# Patient Record
Sex: Female | Born: 1980
Health system: Southern US, Community
[De-identification: ages and names within clinical notes are randomized; demographics above are authoritative.]

## PROBLEM LIST (undated history)

## (undated) DIAGNOSIS — M542 Cervicalgia: Secondary | ICD-10-CM

## (undated) DIAGNOSIS — G43909 Migraine, unspecified, not intractable, without status migrainosus: Secondary | ICD-10-CM

## (undated) HISTORY — DX: Migraine, unspecified, not intractable, without status migrainosus: G43.909

## (undated) HISTORY — DX: Cervicalgia: M54.2

## (undated) HISTORY — PX: TUBAL LIGATION: SHX77

## (undated) HISTORY — PX: ANTERIOR FUSION CERVICAL SPINE: SUR626

---

## 2014-06-02 ENCOUNTER — Emergency Department (HOSPITAL_COMMUNITY)
Admission: EM | Admit: 2014-06-02 | Discharge: 2014-06-02 | Disposition: A | Discharge status: Home / Self Care | Payer: Medicare Other | Attending: Emergency Medicine | Admitting: Emergency Medicine

## 2014-06-02 ENCOUNTER — Emergency Department (HOSPITAL_COMMUNITY): Payer: Medicare Other

## 2014-06-02 ENCOUNTER — Encounter (HOSPITAL_COMMUNITY): Payer: Self-pay | Admitting: *Deleted

## 2014-06-02 DIAGNOSIS — R102 Pelvic and perineal pain: Secondary | ICD-10-CM

## 2014-06-02 DIAGNOSIS — Z3202 Encounter for pregnancy test, result negative: Secondary | ICD-10-CM | POA: Insufficient documentation

## 2014-06-02 DIAGNOSIS — B9689 Other specified bacterial agents as the cause of diseases classified elsewhere: Secondary | ICD-10-CM

## 2014-06-02 DIAGNOSIS — Z9851 Tubal ligation status: Secondary | ICD-10-CM | POA: Diagnosis not present

## 2014-06-02 DIAGNOSIS — N760 Acute vaginitis: Secondary | ICD-10-CM | POA: Diagnosis not present

## 2014-06-02 LAB — URINE MICROSCOPIC-ADD ON

## 2014-06-02 LAB — WET PREP, GENITAL
Trich, Wet Prep: NONE SEEN
YEAST WET PREP: NONE SEEN

## 2014-06-02 LAB — URINALYSIS, ROUTINE W REFLEX MICROSCOPIC
Bilirubin Urine: NEGATIVE
GLUCOSE, UA: NEGATIVE mg/dL
HGB URINE DIPSTICK: NEGATIVE
Ketones, ur: NEGATIVE mg/dL
Nitrite: NEGATIVE
PH: 7.5 (ref 5.0–8.0)
Protein, ur: NEGATIVE mg/dL
Specific Gravity, Urine: 1.023 (ref 1.005–1.030)
Urobilinogen, UA: 1 mg/dL (ref 0.0–1.0)

## 2014-06-02 LAB — PREGNANCY, URINE: Preg Test, Ur: NEGATIVE

## 2014-06-02 MED ORDER — METRONIDAZOLE 500 MG PO TABS: 500.0000 mg | ORAL_TABLET | Freq: Two times a day (BID) | ORAL | Status: DC

## 2014-06-02 MED ORDER — NAPROXEN 250 MG PO TABS: 250.0000 mg | ORAL_TABLET | Freq: Two times a day (BID) | ORAL | Status: DC | PRN

## 2014-06-02 NOTE — ED Provider Notes (Signed)
CSN: [id number]     Arrival date & time 06/02/14  1736 History   First MD Initiated Contact with Patient 06/02/14 1741     Chief Complaint  Patient presents with  . Pelvic Pain  . Nausea     HPI Pt was seen at 1755. Per pt, c/o gradual onset and persistence of constant pelvic "pain" for the past 2 months. Has been associated with vaginal discharge. Describes the pain as "moving bubbles."  Pt has been evaluated by her PMD for same and "I was told it was gas." Denies dysuria/hematuria, no flank pain, no N/V/D, no fevers, no rash.    History reviewed. No pertinent past medical history.   Past Surgical History  Procedure Laterality Date  . Tubal ligation    . Anterior fusion cervical spine     No family history on file. History  Substance Use Topics  . Smoking status: Never Smoker   . Smokeless tobacco: Not on file  . Alcohol Use: No    Review of Systems ROS: Statement: All systems negative except as marked or noted in the HPI; Constitutional: Negative for fever and chills. ; ; Eyes: Negative for eye pain, redness and discharge. ; ; ENMT: Negative for ear pain, hoarseness, nasal congestion, sinus pressure and sore throat. ; ; Cardiovascular: Negative for chest pain, palpitations, diaphoresis, dyspnea and peripheral edema. ; ; Respiratory: Negative for cough, wheezing and stridor. ; ; Gastrointestinal: Negative for nausea, vomiting, diarrhea, abdominal pain, blood in stool, hematemesis, jaundice and rectal bleeding. . ; ; Genitourinary: Negative for dysuria, flank pain and hematuria. ; ; GYN:  +pelvic pain, vaginal discharge. No vaginal bleeding, no vulvar pain. ;; Musculoskeletal: Negative for back pain and neck pain. Negative for swelling and trauma.; ; Skin: Negative for pruritus, rash, abrasions, blisters, bruising and skin lesion.; ; Neuro: Negative for headache, lightheadedness and neck stiffness. Negative for weakness, altered level of consciousness , altered mental status, extremity  weakness, paresthesias, involuntary movement, seizure and syncope.      Allergies  Review of patient's allergies indicates no known allergies.  Home Medications   Prior to Admission medications   Medication Sig Start Date End Date Taking? Authorizing Provider  ibuprofen (ADVIL,MOTRIN) 800 MG tablet Take 800 mg by mouth every 8 (eight) hours as needed for mild pain or moderate pain.   Yes Historical Provider, MD   BP 133/79 mmHg  Pulse 63  Temp(Src) 98.6 F (37 C) (Oral)  Resp 20  SpO2 99%  LMP 05/15/2014 Physical Exam  1800: Physical examination:  Nursing notes reviewed; Vital signs and O2 SAT reviewed;  Constitutional: Well developed, Well nourished, Well hydrated, In no acute distress; Head:  Normocephalic, atraumatic; Eyes: EOMI, PERRL, No scleral icterus; ENMT: Mouth and pharynx normal, Mucous membranes moist; Neck: Supple, Full range of motion, No lymphadenopathy; Cardiovascular: Regular rate and rhythm, No murmur, rub, or gallop; Respiratory: Breath sounds clear & equal bilaterally, No rales, rhonchi, wheezes.  Speaking full sentences with ease, Normal respiratory effort/excursion; Chest: Nontender, Movement normal; Abdomen: Soft, +suprapubic and left pelvic tenderness to palp. No rebound or guarding. Nondistended, Normal bowel sounds; Genitourinary: No CVA tenderness. Pelvic exam performed with permission of pt and female ED tech assist during exam.  External genitalia w/o lesions. Vaginal vault with thin white discharge.  Cervix w/o lesions, not friable, GC/chlam and wet prep obtained and sent to lab.  Bimanual exam w/o CMT, uterine or adnexal tenderness.; Extremities: Pulses normal, No tenderness, No edema, No calf edema or asymmetry.; Neuro:  AA&Ox3, Major CN grossly intact.  Speech clear. No gross focal motor or sensory deficits in extremities. Climbs on and off stretcher easily by herself. Gait steady.; Skin: Color normal, Warm, Dry.   ED Course  Procedures     EKG  Interpretation None      MDM  MDM Reviewed: nursing note and vitals Interpretation: labs and ultrasound     Results for orders placed or performed during the hospital encounter of 06/02/14  Wet prep, genital  Result Value Ref Range   Yeast Wet Prep HPF POC NONE SEEN NONE SEEN   Trich, Wet Prep NONE SEEN NONE SEEN   Clue Cells Wet Prep HPF POC FEW (A) NONE SEEN   WBC, Wet Prep HPF POC RARE (A) NONE SEEN  Urinalysis, Routine w reflex microscopic  Result Value Ref Range   Color, Urine YELLOW YELLOW   APPearance CLOUDY (A) CLEAR   Specific Gravity, Urine 1.023 1.005 - 1.030   pH 7.5 5.0 - 8.0   Glucose, UA NEGATIVE NEGATIVE mg/dL   Hgb urine dipstick NEGATIVE NEGATIVE   Bilirubin Urine NEGATIVE NEGATIVE   Ketones, ur NEGATIVE NEGATIVE mg/dL   Protein, ur NEGATIVE NEGATIVE mg/dL   Urobilinogen, UA 1.0 0.0 - 1.0 mg/dL   Nitrite NEGATIVE NEGATIVE   Leukocytes, UA TRACE (A) NEGATIVE  Pregnancy, urine  Result Value Ref Range   Preg Test, Ur NEGATIVE NEGATIVE  Urine microscopic-add on  Result Value Ref Range   Squamous Epithelial / LPF FEW (A) RARE   WBC, UA 0-2 <3 WBC/hpf   RBC / HPF 3-6 <3 RBC/hpf   Bacteria, UA FEW (A) RARE   [place] Pelvis Complete 06/02/2014   CLINICAL DATA:  Initial evaluate for pelvic pain, concern for torsion  EXAM: TRANSABDOMINAL AND TRANSVAGINAL ULTRASOUND OF PELVIS  DOPPLER ULTRASOUND OF OVARIES  TECHNIQUE: Both transabdominal and transvaginal ultrasound examinations of the pelvis were performed. Transabdominal technique was performed for global imaging of the pelvis including uterus, ovaries, adnexal regions, and pelvic cul-de-sac.  It was necessary to proceed with endovaginal exam following the transabdominal exam to visualize the endometrium and ovaries. Color and duplex Doppler ultrasound was utilized to evaluate blood flow to the ovaries.  COMPARISON:  None.  FINDINGS: Uterus  Measurements: 9.6 x 5.8 x 6.6 cm. No fibroids or other focal mass is identified.  However, echotexture of the uterus is heterogeneous suggesting the possibility of adenomyosis or diffuse fibroid involvement.  Endometrium  Thickness: 10 mm.  No focal abnormality visualized.  Right ovary  Measurements: 38 x 22 x 27 mm. Normal appearance/no adnexal mass. 18 mm follicular cyst noted.  Left ovary  Measurements: 41 x 19 x 34 mm. Normal appearance/no adnexal mass.  Pulsed Doppler evaluation of both ovaries demonstrates normal low-resistance arterial and venous waveforms.  Other findings  Trace free fluid in the left adnexa.  IMPRESSION: Physiologic trace volume of free fluid.  No evidence of torsion.  Heterogeneous myometrium suggests possibility of adenomyosis or diffuse fibroid involvement, with no discrete mass lesions identified.   Electronically Signed   By: Esperanza Heiraymond  Rubner M.D.   On: 06/02/2014 19:22    2030:  Will tx for BV. Workup otherwise reassuring, will need to f/u with OB/GYN MD. Pt has gotten herself dressed and wants to go home now. Dx and testing d/w pt.  Questions answered.  Verb understanding, agreeable to d/c home with outpt f/u.   Samuel JesterKathleen Jamarious Febo, DO 06/03/14 1356

## 2014-06-02 NOTE — ED Notes (Signed)
Pt returned from US

## 2014-06-02 NOTE — Discharge Instructions (Signed)
°Emergency Department Resource Guide °1) Find a Doctor and Pay Out of Pocket °Although you won't have to find out who is covered by your insurance plan, it is a good idea to ask around and get recommendations. You will then need to call the office and see if the doctor you have chosen will accept you as a new patient and what types of options they offer for patients who are self-pay. Some doctors offer discounts or will set up payment plans for their patients who do not have insurance, but you will need to ask so you aren't surprised when you get to your appointment. ° °2) Contact Your Local Health Department °Not all health departments have doctors that can see patients for sick visits, but many do, so it is worth a call to see if yours does. If you don't know where your local health department is, you can check in your phone book. The CDC also has a tool to help you locate your state's health department, and many state websites also have listings of all of their local health departments. ° °3) Find a Walk-in Clinic °If your illness is not likely to be very severe or complicated, you may want to try a walk in clinic. These are popping up all over the country in pharmacies, drugstores, and shopping centers. They're usually staffed by nurse practitioners or physician assistants that have been trained to treat common illnesses and complaints. They're usually fairly quick and inexpensive. However, if you have serious medical issues or chronic medical problems, these are probably not your best option. ° °No Primary Care Doctor: °- Call Health Connect at  832-8000 - they can help you locate a primary care doctor that  accepts your insurance, provides certain services, etc. °- Physician Referral Service- 1-800-533-3463 ° °Chronic Pain Problems: °Organization         Address  Phone   Notes  °[facility]  ([phone]) [phone] Patients need to be referred by their primary care doctor.  ° °Medication  Assistance: °Organization         Address  Phone   Notes  °[place] [facility] [address] °[place], [place] [address] (336) 641-8030 --Must be a resident of [place] °-- Must have NO insurance coverage whatsoever (no Medicaid/ Medicare, etc.) °-- The pt. MUST have a primary care doctor that directs their care regularly and follows them in the community °  °[facility]  (866) 331-1348   °[employer]  (888) 892-1162   ° °Agencies that provide inexpensive medical care: °Organization         Address  Phone   Notes  °[facility]  (336) 832-8035   °[facility]    (336) 832-7272   °[facility] [address] °[place], [place] [address] (336) 832-4777   °[facility] [place] [address], °[place] (336) 271-4999   °[facility]    (336) 373-0678   °[facility]    (336) 272-1050   °[facility] ° [address], [place] Phone:  (336) 832-4444, Fax:  (336) 832-4440 Hours of Operation:  9 am - 6 pm, M-F.  Also accepts Medicaid/Medicare and self-pay.  °[facility] ° [address], [place] Phone: (336) [phone], Fax: (336) [phone]. Hours of Operation:  8:30 am - 5:30 pm, M-F.  Also accepts Medicaid and self-pay.  °[facility] [place] [address], [place] Phone: (336) 878-6027   °[facility] [address], [place], [place] (336)723-1848, Ext. 123 Mondays & Thursdays: 7-9 AM.  First 15 patients are seen on a first come, first serve basis. °  ° °Medicaid-accepting [place] Providers: ° °Organization         Address  Phone   Notes  °[facility] [address], [place] (336) [phone] Also accepts self-pay patients.  °[facility] [address], [place] ° (336) 856-9996   °[facility] [address], [place]  (336) 288-8857   °[facility] [address] [place] [address], [place] (336) 299-7000   °Veita Bland [address], [place]  ° (336) 373-1557 Only accepts [place] Access Medicaid patients after they have their name applied to their card.  ° °Self-Pay (no insurance) in [place]: ° °Organization         Address  Phone   Notes  °Sickle Cell Patients, [facility] [address], [place] (336) 832-1970   °[facility] [address], [place] (336) 832-4400   °[facility] ° [address], [place] (336) 992-4800   °[facility]/Dr. Osei-Bonsu ° [address] [place] [address], [place] or [address], [place] (336) [phone] Phone number for both [place] and [place] locations is the same.  °[facility] [address], [place] (336) 299-0000   °[facility] [address] [place] [address], [place] or [address] (336) 852-7530 °(336) 878-2260   °[facility] [address], [place] (336) 350-1642, phone; (336) 294-5005, fax Sees patients 1st and 3rd Saturday of every month.  Must not qualify for public or private insurance (i.e. Medicaid, Medicare, [place] Health Choice, Veterans' Benefits) • Household income should be no more than 200% of the poverty level •The clinic cannot treat you if you are pregnant or think you are pregnant • Sexually transmitted diseases are not treated at the clinic.  ° ° °Dental Care: °Organization         Address  Phone  Notes  °[place] [facility] [address], [place] (336) 641-6152 Accepts children up to age 21 who are enrolled in Medicaid or [place] Health Choice; pregnant women with a Medicaid card; and children who have applied for Medicaid or [place] Health Choice, but were declined, whose parents can pay a reduced fee at time of service.  °[place]  [facility] [place]  [address], [place] (336) 641-7733 Accepts children up to age 21 who are enrolled in Medicaid or [place] Health Choice; pregnant women with a Medicaid card; and children who have applied for Medicaid or [place] Health Choice, but were declined, whose parents can pay a reduced fee at time of service.  °[facility] ° [address], [place] (336) 641-4533 Patients are seen by appointment only. Walk-ins are not accepted. [facility] will see patients 18 years of age and older. °Monday - Tuesday (8am-5pm) °Most Wednesdays (8:30-5pm) °$30 per visit, cash only  °[facility] ° [address], [place] (336) 641-4533 Patients are seen by appointment only. Walk-ins are not accepted. [facility] will see patients 18 years of age and older. °One   Wednesday Evening (Monthly: Volunteer Based).  $30 per visit, cash only  °[facility]  (919) [phone] for adults; Children under age 4, call Graduate Pediatric Dentistry at ([phone]) [phone]. Children aged 4-14, please call ([phone]) [phone] to request a pediatric application. ° Dental services are provided in all areas of dental care including fillings, crowns and bridges, complete and partial dentures, implants, gum treatment, root canals, and extractions. Preventive care is also provided. Treatment is provided to both adults and children. °Patients are selected via a lottery and there is often a waiting list. °  °[facility] [address], °[place] ° (336) 763-8833 [email or web address] °  °[facility] [address], [place], [place] (336)723-1848, Ext. 123 Second and Fourth Thursday of each month, opens at 6:30 AM; Clinic ends at 9 AM.  Patients are seen on a first-come first-served basis, and a limited number are seen during each clinic.  ° °[facility] ° [address], [place], [place] (336) [phone]    Eligibility Requirements °You must have lived in [place], [place], or [place] counties for at least the last three months. °  You cannot be eligible for state or federal sponsored healthcare insurance, including Veterans Administration, Medicaid, or Medicare. °  You generally cannot be eligible for healthcare insurance through your employer.  °  How to apply: °Eligibility screenings are held every Tuesday and Wednesday afternoon from 1:00 pm until 4:00 pm. You do not need an appointment for the interview!  °[facility] [address], [place], [place] 336-631-2330   °[facility]  336-342-8273   °[facility]  336-703-3100   °[facility]  336-570-6415   ° °Behavioral Health Resources in the Community: °Intensive Outpatient Programs °Organization         Address  Phone  Notes  °[facility] [address], [place], [place] 336-878-6098   °[facility] [address], [place], [place] 336-832-9800   °[facility] [address], [place], [place] ° 336-882-2125   °[facility] [address],  °[place], [place] 1-800-853-5163 or 336-641-4981   °Substance Abuse Resources °Organization         Address  Phone  Notes  °[facility]  336-882-2125   °[facility]  336-784-9470   °[facility]  336-285-9073   °[facility]  336-845-3988   °Residential & Outpatient Substance Abuse Program  1-800-659-3381   °Psychological Services °Organization         Address  Phone  Notes  °[facility]  336- 832-9600   °[facility]  336- 378-7881   °[facility] [address], [place] 1-800-853-5163 or 336-641-4981   ° °Mobile Crisis Teams °Organization         Address  Phone  Notes  °[facility], Mobile Crisis Care Unit  1-877-626-1772   °Assertive °[facility] ° [address]  [place], [place] 336-834-9664   °Sharon DeEsch [address] °[place] 336-554-5454   ° °Self-Help/Support Groups °Organization         Address  Phone             Notes  °[facility] of  - variety of support groups  336- 373-1402 Call for more information  °Narcotics Anonymous (NA), [facility] [address], °[place]  2 meetings at this location  ° °  Residential Treatment Programs Organization         Address  Phone  Notes  [facility] [address],    [place]  [phone]   [facility]  [address], [place] [phone], [place], [place] [phone]) [phone]   [facility] [address], [place] [phone](647) 660-4023 Admissions: 8am-3pm M-F  [facility] [address]    [place], [place] [phone]   [facility] [address], [place], [place] [phone]   [facility] [address]  [place], [place] [phone]   [facility] Intensive Outpatient [address], [place], [place] [phone]   [facility] [address]  [place], [place] [phone] or [phone]   [facility] ([facility] [address] [place], [place] [phone] Accepts Medicaid  [facility] [address]  [place] [phone] Substance Abuse/Addiction Treatment   [facility] Resources Organization         Address  Phone  Notes  [facility]  [phone](888) [phone]   Angie FavaJulie Brannon, PhD [address], [address] [place], [place]   [phone](336) [phone] or [phone](336) [phone]   [facility]   [address] [place], [place] [phone](336) [phone]   [facility] [address], [place], [place] [phone](336) [phone] Insurance/Medicaid/sponsorship through [facility]  [facility] [address]                                    [place], [place] [phone](336) [phone] Therapy/tele-psych/case    [facility] [address]   [place], [place] [phone](336) [phone]    Dr. Lolly MustacheArfeen  ([phone](336) [phone]   [facility] [place]  [employer] [facility] 1) [address], [place] 2) [address], [place] 3)  [address], [place] [phone](336) ([phone](336) (480) [phone]  [phone](336) [phone]   [facility] Child Abuse Hotline [phone](336) [phone] or [phone](336) [phone] (After Hours)      Take the prescriptions as directed. Your pelvic ultrasound showed: "echotexture of the uterus is heterogeneous suggesting the possibility of adenomyosis or diffuse fibroid involvement."  Call your OB/GYN doctor on Monday to schedule a follow up appointment within the next week.  Return to the Emergency Department immediately sooner if worsening.

## 2014-06-02 NOTE — ED Notes (Signed)
Pt reports feeling "moving/bubbles" in her abd.for the past month. C/o nausea for a week. Sts she has had her tubes tied but was told she could still get pregnant. Took preg test last week, negative but sts her previous pregnancies never showed thru urine tests. LMP 05-15-14. Lower abd pain x1 week. Denies vaginal bleeding. Small amt vaginal discharge.

## 2014-06-04 LAB — GC/CHLAMYDIA PROBE AMP
CT Probe RNA: NEGATIVE
GC Probe RNA: NEGATIVE

## 2014-12-02 ENCOUNTER — Encounter (HOSPITAL_COMMUNITY): Payer: Self-pay | Admitting: *Deleted

## 2014-12-02 ENCOUNTER — Emergency Department (HOSPITAL_COMMUNITY)
Admission: EM | Admit: 2014-12-02 | Discharge: 2014-12-02 | Disposition: A | Discharge status: Home / Self Care | Payer: Medicare Other | Attending: Emergency Medicine | Admitting: Emergency Medicine

## 2014-12-02 ENCOUNTER — Emergency Department (HOSPITAL_COMMUNITY): Payer: Medicare Other

## 2014-12-02 DIAGNOSIS — N939 Abnormal uterine and vaginal bleeding, unspecified: Secondary | ICD-10-CM | POA: Diagnosis present

## 2014-12-02 DIAGNOSIS — R1030 Lower abdominal pain, unspecified: Secondary | ICD-10-CM | POA: Insufficient documentation

## 2014-12-02 DIAGNOSIS — Z9851 Tubal ligation status: Secondary | ICD-10-CM | POA: Diagnosis not present

## 2014-12-02 DIAGNOSIS — Z3202 Encounter for pregnancy test, result negative: Secondary | ICD-10-CM | POA: Insufficient documentation

## 2014-12-02 LAB — COMPREHENSIVE METABOLIC PANEL
ALK PHOS: 49 U/L (ref 38–126)
ALT: 15 U/L (ref 14–54)
ANION GAP: 7 (ref 5–15)
AST: 23 U/L (ref 15–41)
Albumin: 3.9 g/dL (ref 3.5–5.0)
BUN: 6 mg/dL (ref 6–20)
CHLORIDE: 106 mmol/L (ref 101–111)
CO2: 27 mmol/L (ref 22–32)
Calcium: 9.4 mg/dL (ref 8.9–10.3)
Creatinine, Ser: 0.82 mg/dL (ref 0.44–1.00)
GFR calc Af Amer: >60 mL/min (ref >60)
GFR calc non Af Amer: >60 mL/min (ref >60)
GLUCOSE: 88 mg/dL (ref 65–99)
Potassium: 4.2 mmol/L (ref 3.5–5.1)
Sodium: 140 mmol/L (ref 135–145)
TOTAL PROTEIN: 7 g/dL (ref 6.5–8.1)
Total Bilirubin: 0.6 mg/dL (ref 0.3–1.2)

## 2014-12-02 LAB — URINALYSIS, ROUTINE W REFLEX MICROSCOPIC
BILIRUBIN URINE: NEGATIVE
GLUCOSE, UA: NEGATIVE mg/dL
Ketones, ur: NEGATIVE mg/dL
Leukocytes, UA: NEGATIVE
Nitrite: NEGATIVE
PROTEIN: NEGATIVE mg/dL
Specific Gravity, Urine: 1.013 (ref 1.005–1.030)
Urobilinogen, UA: 0.2 mg/dL (ref 0.0–1.0)
pH: 7.5 (ref 5.0–8.0)

## 2014-12-02 LAB — WET PREP, GENITAL
Trich, Wet Prep: NONE SEEN
Yeast Wet Prep HPF POC: NONE SEEN

## 2014-12-02 LAB — CBC WITH DIFFERENTIAL/PLATELET
BASOS ABS: 0 10*3/uL (ref 0.0–0.1)
BASOS PCT: 0 % (ref 0–1)
EOS PCT: 2 % (ref 0–5)
Eosinophils Absolute: 0.1 10*3/uL (ref 0.0–0.7)
HEMATOCRIT: 35.9 % — AB (ref 36.0–46.0)
HEMOGLOBIN: 11.7 g/dL — AB (ref 12.0–15.0)
LYMPHS ABS: 1.8 10*3/uL (ref 0.7–4.0)
Lymphocytes Relative: 28 % (ref 12–46)
MCH: 27.4 pg (ref 26.0–34.0)
MCHC: 32.6 g/dL (ref 30.0–36.0)
MCV: 84.1 fL (ref 78.0–100.0)
Monocytes Absolute: 0.4 10*3/uL (ref 0.1–1.0)
Monocytes Relative: 7 % (ref 3–12)
Neutro Abs: 3.9 10*3/uL (ref 1.7–7.7)
Neutrophils Relative %: 63 % (ref 43–77)
Platelets: 297 10*3/uL (ref 150–400)
RBC: 4.27 MIL/uL (ref 3.87–5.11)
RDW: 14.4 % (ref 11.5–15.5)
WBC: 6.2 10*3/uL (ref 4.0–10.5)

## 2014-12-02 LAB — URINE MICROSCOPIC-ADD ON

## 2014-12-02 LAB — POC URINE PREG, ED: Preg Test, Ur: NEGATIVE

## 2014-12-02 MED ORDER — SODIUM CHLORIDE 0.9 % IV BOLUS (SEPSIS)
1000.0000 mL | Freq: Once | INTRAVENOUS | Status: AC
  Administered 2014-12-02: 1000 mL via INTRAVENOUS

## 2014-12-02 NOTE — ED Notes (Signed)
Pt states that she has had irregular period last month and spotting since then. Pt also reports lower abdominal cramping.

## 2014-12-02 NOTE — ED Provider Notes (Signed)
CSN: [id number]     Arrival date & time 12/02/14  1010 History   First MD Initiated Contact with Patient 12/02/14 1124     Chief Complaint  Patient presents with  . Vaginal Bleeding     (Consider location/radiation/quality/duration/timing/severity/associated sxs/prior Treatment) The history is provided by the patient. No language interpreter was used.  Gabriela Gonzalez is a 34 y/o F with PMHx of tubal ligation approximate 5 years ago presenting to the ED with vaginal bleeding that started on 11/30/2014. Patient reports that the vaginal bleeding is a very light pink discharge, states that she's been wearing panty liners without saturation-reports that she sees it only when she wipes and only on the panty liners. Reported that within the past couple of days she is only had to change her pain to liner 2 times. Reported that she has not seen any blood clots. Reported that she started to experience sharp pain to the middle of her lower abdomen yesterday, that lasted a couple of hours. Reported that the pain was intense. Stated that today she started to experience some cramping in the morning localized to her lower abdomen with radiation towards her back that is now resolved. Stated that she did have an episode of nausea yesterday that is now resolved without episodes of emesis. Reported that she did have a bowel movement yesterday and today that appeared to be normal. Reported that she normally gets her menstrual periods every 19th of the month. Reported that she is sexually active with her husband. Denied melena, hematochezia, blood clots, vaginal discharge, odor, dysuria, hematuria, chest pain, shortness of breath, difficulty breathing, decreased urination, vomiting, diarrhea, dizziness, fainting. PCP Dr. Jonell Cluck  [facility]  History reviewed. No pertinent past medical history. Past Surgical History  Procedure Laterality Date  . Tubal ligation    . Anterior fusion cervical spine     No family  history on file. History  Substance Use Topics  . Smoking status: Never Smoker   . Smokeless tobacco: Not on file  . Alcohol Use: No   OB History    No data available     Review of Systems  Constitutional: Negative for fever and chills.  Eyes: Negative for visual disturbance.  Respiratory: Negative for chest tightness and shortness of breath.   Cardiovascular: Negative for chest pain.  Gastrointestinal: Positive for nausea and abdominal pain. Negative for vomiting, diarrhea, constipation, blood in stool and anal bleeding.  Genitourinary: Positive for vaginal bleeding and pelvic pain. Negative for dysuria, urgency, vaginal discharge and vaginal pain.  Musculoskeletal: Positive for back pain. Negative for neck pain and neck stiffness.  Neurological: Negative for dizziness, weakness and headaches.      Allergies  Review of patient's allergies indicates no known allergies.  Home Medications   Prior to Admission medications   Medication Sig Start Date End Date Taking? Authorizing Provider  ibuprofen (ADVIL,MOTRIN) 800 MG tablet Take 800 mg by mouth every 8 (eight) hours as needed for mild pain or moderate pain.   Yes Historical Provider, MD   BP 100/58 mmHg  Pulse 75  Temp(Src) 98.3 F (36.8 C) (Oral)  Resp 18  Ht  (1.499 m)  Wt 169 lb (76.658 kg)  BMI 34.12 kg/m2  SpO2 100% Physical Exam  Constitutional: She is oriented to person, place, and time. She appears well-developed and well-nourished. No distress.  HENT:  Head: Normocephalic and atraumatic.  Mouth/Throat: Oropharynx is clear and moist. No oropharyngeal exudate.  Eyes: Conjunctivae and EOM are normal. Pupils  are equal, round, and reactive to light. Right eye exhibits no discharge. Left eye exhibits no discharge.  Neck: Normal range of motion. Neck supple. No tracheal deviation present.  Cardiovascular: Normal rate, regular rhythm and normal heart sounds.  Exam reveals no friction rub.   No murmur  heard. Pulmonary/Chest: Effort normal and breath sounds normal. No respiratory distress. She has no wheezes. She has no rales.  Abdominal: Soft. Bowel sounds are normal. She exhibits no distension. There is tenderness in the suprapubic area. There is no rebound, no guarding and no CVA tenderness.  Genitourinary:  Pelvic exam: Negative swelling, erythema, inflammation, lesions, sores, deformities, areas of fluctuance or induration identified to the external genitalia. Bright red blood in the vaginal vault identified coming from the cervix. Negative CMT or bilateral adnexal tenderness noted. Exam chaperoned with tech, Marcelle SmilingNatasha.  Musculoskeletal: Normal range of motion.  Lymphadenopathy:    She has no cervical adenopathy.  Neurological: She is alert and oriented to person, place, and time. No cranial nerve deficit. She exhibits normal muscle tone. Coordination normal. GCS eye subscore is 4. GCS verbal subscore is 5. GCS motor subscore is 6.  Skin: Skin is warm and dry. No rash noted. She is not diaphoretic. No erythema.  Psychiatric: She has a normal mood and affect. Her behavior is normal. Thought content normal.  Nursing note and vitals reviewed.   ED Course  Procedures (including critical care time)  Results for orders placed or performed during the hospital encounter of 12/02/14  Wet prep, genital  Result Value Ref Range   Yeast Wet Prep HPF POC NONE SEEN NONE SEEN   Trich, Wet Prep NONE SEEN NONE SEEN   Clue Cells Wet Prep HPF POC FEW (A) NONE SEEN   WBC, Wet Prep HPF POC FEW (A) NONE SEEN  CBC with Differential/Platelet  Result Value Ref Range   WBC 6.2 4.0 - 10.5 K/uL   RBC 4.27 3.87 - 5.11 MIL/uL   Hemoglobin 11.7 (L) 12.0 - 15.0 g/dL   HCT 69.635.9 (L) 29.536.0 - 28.446.0 %   MCV 84.1 78.0 - 100.0 fL   MCH 27.4 26.0 - 34.0 pg   MCHC 32.6 30.0 - 36.0 g/dL   RDW 13.214.4 44.011.5 - 10.215.5 %   Platelets 297 150 - 400 K/uL   Neutrophils Relative % 63 43 - 77 %   Neutro Abs 3.9 1.7 - 7.7 K/uL    Lymphocytes Relative 28 12 - 46 %   Lymphs Abs 1.8 0.7 - 4.0 K/uL   Monocytes Relative 7 3 - 12 %   Monocytes Absolute 0.4 0.1 - 1.0 K/uL   Eosinophils Relative 2 0 - 5 %   Eosinophils Absolute 0.1 0.0 - 0.7 K/uL   Basophils Relative 0 0 - 1 %   Basophils Absolute 0.0 0.0 - 0.1 K/uL  Comprehensive metabolic panel  Result Value Ref Range   Sodium 140 135 - 145 mmol/L   Potassium 4.2 3.5 - 5.1 mmol/L   Chloride 106 101 - 111 mmol/L   CO2 27 22 - 32 mmol/L   Glucose, Bld 88 65 - 99 mg/dL   BUN 6 6 - 20 mg/dL   Creatinine, Ser 7.250.82 0.44 - 1.00 mg/dL   Calcium 9.4 8.9 - 36.610.3 mg/dL   Total Protein 7.0 6.5 - 8.1 g/dL   Albumin 3.9 3.5 - 5.0 g/dL   AST 23 15 - 41 U/L   ALT 15 14 - 54 U/L   Alkaline Phosphatase 49 38 -  126 U/L   Total Bilirubin 0.6 0.3 - 1.2 mg/dL   GFR calc non Af Amer >60 >60 mL/min   GFR calc Af Amer >60 >60 mL/min   Anion gap 7 5 - 15  Urinalysis, Routine w reflex microscopic (not at [facility])  Result Value Ref Range   Color, Urine YELLOW YELLOW   APPearance CLOUDY (A) CLEAR   Specific Gravity, Urine 1.013 1.005 - 1.030   pH 7.5 5.0 - 8.0   Glucose, UA NEGATIVE NEGATIVE mg/dL   Hgb urine dipstick LARGE (A) NEGATIVE   Bilirubin Urine NEGATIVE NEGATIVE   Ketones, ur NEGATIVE NEGATIVE mg/dL   Protein, ur NEGATIVE NEGATIVE mg/dL   Urobilinogen, UA 0.2 0.0 - 1.0 mg/dL   Nitrite NEGATIVE NEGATIVE   Leukocytes, UA NEGATIVE NEGATIVE  Urine microscopic-add on  Result Value Ref Range   Squamous Epithelial / LPF FEW (A) RARE   WBC, UA 0-2 <3 WBC/hpf   RBC / HPF 0-2 <3 RBC/hpf   Bacteria, UA FEW (A) RARE  POC urine preg, ED (not at [facility])  Result Value Ref Range   Preg Test, Ur NEGATIVE NEGATIVE    Labs Review Labs Reviewed  WET PREP, GENITAL - Abnormal; Notable for the following:    Clue Cells Wet Prep HPF POC FEW (*)    WBC, Wet Prep HPF POC FEW (*)    All other components within normal limits  CBC WITH DIFFERENTIAL/PLATELET - Abnormal; Notable for the following:     Hemoglobin 11.7 (*)    HCT 35.9 (*)    All other components within normal limits  URINALYSIS, ROUTINE W REFLEX MICROSCOPIC (NOT AT [facility]) - Abnormal; Notable for the following:    APPearance CLOUDY (*)    Hgb urine dipstick LARGE (*)    All other components within normal limits  URINE MICROSCOPIC-ADD ON - Abnormal; Notable for the following:    Squamous Epithelial / LPF FEW (*)    Bacteria, UA FEW (*)    All other components within normal limits  COMPREHENSIVE METABOLIC PANEL  POC URINE PREG, ED  GC/CHLAMYDIA PROBE AMP ([place]) NOT AT [facility]    Imaging Review US Transvaginal Non-ob  12/02/2014   CLINICAL DATA:  Acute onset pelvic pain and vaginal bleeding yesterday. LMP 10/30/2014.  EXAM: TRANSABDOMINAL AND TRANSVAGINAL ULTRASOUND OF PELVIS  DOPPLER ULTRASOUND OF OVARIES  TECHNIQUE: Both transabdominal and transvaginal ultrasound examinations of the pelvis were performed. Transabdominal technique was performed for global imaging of the pelvis including uterus, ovaries, adnexal regions, and pelvic cul-de-sac.  It was necessary to proceed with endovaginal exam following the transabdominal exam to visualize the endometrial stripe and ovaries. Color and duplex Doppler ultrasound was utilized to evaluate blood flow to the ovaries.  COMPARISON:  06/02/2014  FINDINGS: Uterus  Measurements: 11.6 x 4.8 x 6.4 cm. No fibroids or other mass visualized.  Endometrium  Thickness: 7 mm.  No focal abnormality visualized.  Right ovary  Measurements: 3.1 x 1.3 x 2.6 cm. Normal appearance/no adnexal mass.  Left ovary  Measurements: 3.2 x 1.9 x 2.6 cm. Normal appearance/no adnexal mass.  Pulsed Doppler evaluation of both ovaries demonstrates normal low-resistance arterial and venous waveforms.  Other findings  No free fluid.  IMPRESSION: Heterogeneous echogenic to uterine myometrium, without definite fibroids.  Normal appearance of both ovaries.  No adnexal mass identified.  No sonographic evidence for ovarian  torsion.   Electronically Signed   By: Myles Rosenthal M.D.   On: 12/02/2014 14:17   US Pelvis  Complete  12/02/2014   CLINICAL DATA:  Acute onset pelvic pain and vaginal bleeding yesterday. LMP 10/30/2014.  EXAM: TRANSABDOMINAL AND TRANSVAGINAL ULTRASOUND OF PELVIS  DOPPLER ULTRASOUND OF OVARIES  TECHNIQUE: Both transabdominal and transvaginal ultrasound examinations of the pelvis were performed. Transabdominal technique was performed for global imaging of the pelvis including uterus, ovaries, adnexal regions, and pelvic cul-de-sac.  It was necessary to proceed with endovaginal exam following the transabdominal exam to visualize the endometrial stripe and ovaries. Color and duplex Doppler ultrasound was utilized to evaluate blood flow to the ovaries.  COMPARISON:  06/02/2014  FINDINGS: Uterus  Measurements: 11.6 x 4.8 x 6.4 cm. No fibroids or other mass visualized.  Endometrium  Thickness: 7 mm.  No focal abnormality visualized.  Right ovary  Measurements: 3.1 x 1.3 x 2.6 cm. Normal appearance/no adnexal mass.  Left ovary  Measurements: 3.2 x 1.9 x 2.6 cm. Normal appearance/no adnexal mass.  Pulsed Doppler evaluation of both ovaries demonstrates normal low-resistance arterial and venous waveforms.  Other findings  No free fluid.  IMPRESSION: Heterogeneous echogenic to uterine myometrium, without definite fibroids.  Normal appearance of both ovaries.  No adnexal mass identified.  No sonographic evidence for ovarian torsion.   Electronically Signed   By: Myles Rosenthal M.D.   On: 12/02/2014 14:17   [place] Art/ven Flow Abd Pelv Doppler  12/02/2014   CLINICAL DATA:  Acute onset pelvic pain and vaginal bleeding yesterday. LMP 10/30/2014.  EXAM: TRANSABDOMINAL AND TRANSVAGINAL ULTRASOUND OF PELVIS  DOPPLER ULTRASOUND OF OVARIES  TECHNIQUE: Both transabdominal and transvaginal ultrasound examinations of the pelvis were performed. Transabdominal technique was performed for global imaging of the pelvis including uterus, ovaries,  adnexal regions, and pelvic cul-de-sac.  It was necessary to proceed with endovaginal exam following the transabdominal exam to visualize the endometrial stripe and ovaries. Color and duplex Doppler ultrasound was utilized to evaluate blood flow to the ovaries.  COMPARISON:  06/02/2014  FINDINGS: Uterus  Measurements: 11.6 x 4.8 x 6.4 cm. No fibroids or other mass visualized.  Endometrium  Thickness: 7 mm.  No focal abnormality visualized.  Right ovary  Measurements: 3.1 x 1.3 x 2.6 cm. Normal appearance/no adnexal mass.  Left ovary  Measurements: 3.2 x 1.9 x 2.6 cm. Normal appearance/no adnexal mass.  Pulsed Doppler evaluation of both ovaries demonstrates normal low-resistance arterial and venous waveforms.  Other findings  No free fluid.  IMPRESSION: Heterogeneous echogenic to uterine myometrium, without definite fibroids.  Normal appearance of both ovaries.  No adnexal mass identified.  No sonographic evidence for ovarian torsion.   Electronically Signed   By: Myles Rosenthal M.D.   On: 12/02/2014 14:17     EKG Interpretation None      MDM   Final diagnoses:  Vaginal bleeding  Abnormal uterine bleeding    Medications  sodium chloride 0.9 % bolus 1,000 mL (0 mLs Intravenous Stopped 12/02/14 1609)    Filed Vitals:   12/02/14 1457 12/02/14 1500 12/02/14 1530 12/02/14 1608  BP: 103/60 110/63 103/65 100/58  Pulse: 69 70 62 75  Temp:    98.3 F (36.8 C)  TempSrc:      Resp: Height:      Weight:      SpO2: 98% 99% 100% 100%   CBC negative elevated leukocytosis. Hemoglobin 11.7, hematocrit 35.9. CMP unremarkable. Urine pregnancy negative. Urinalysis noted large hemoglobin with negative nitrites or leukocytes-negative findings of infection. Hemoglobin, RBC count of 0-2 microscopic analysis, unremarkable. Wet prep noted  few clue cells and a few white blood cells otherwise unremarkable. Pelvic ultrasound noted heterogeneous echogenic to uterine myometrium without definite fibroids. Normal  appearance of both ovaries with no adnexal mass. No sonographic evidence of ovarian torsion. Negative findings of torsion. Negative findings of TOA. Doubt PID. Negative findings of UTI or pyelonephritis. Definitive etiology of abnormal uterine bleeding unknown. Patient comfortable without acute distress. Patient stable, afebrile. Discharged patient. Discussed with patient to rest and stay hydrated. Referred patient to her primary care provider as well as her OB/GYN. Discussed with patient to closely monitor symptoms and if symptoms are to worsen or change to report back to the ED - strict return instructions given.  Patient agreed to plan of care, understood, all questions answered.   Raymon Mutton, PA-C 12/02/14 1655  Doug Sou, MD 12/02/14 [phone]

## 2014-12-02 NOTE — ED Notes (Signed)
Pt tranported to US.

## 2014-12-02 NOTE — ED Notes (Signed)
Pt ambulated to the bathroom with ease 

## 2014-12-02 NOTE — ED Notes (Signed)
Pt states hx of tubal ligation x5 years ago, states that her period was 10/30/14 when she normally has her period on 19th of every month, states period lasted 3 days but returned with spotting on 12/01/14. Pt also reports sever abd pain that started yesterday, and has been intermittent since. Pt reports some mild nausea yesterday but denies any n/v/d. Pt also denies any fevers. Pt denies taking any birth control. Denies any other vaginal discharge or odor.

## 2014-12-02 NOTE — Discharge Instructions (Signed)
Please call your doctor for a followup appointment within 24-48 hours. When you talk to your doctor please let them know that you were seen in the emergency department and have them acquire all of your records so that they can discuss the findings with you and formulate a treatment plan to fully care for your new and ongoing problems. Please follow-up with primary care provider earlier this week Please follow-up with OB/GYN earlier this week Please rest and stay hydrated Please avoid any physical or strenuous activity Please closely monitor vaginal bleeding-if increases, blood clots Please continue to monitor symptoms closely and if symptoms are to worsen or change (fever greater than 101, chills, sweating, nausea, vomiting, chest pain, shortness of breathe, difficulty breathing, weakness, numbness, tingling, worsening or changes to pain pattern, increased vaginal bleeding, cramping, blood in the stools, black tarry stools, blood clots dizziness, fainting, visual distortions) please report back to the Emergency Department immediately.     Abnormal Uterine Bleeding Abnormal uterine bleeding means bleeding from the vagina that is not your normal menstrual period. This can be:  Bleeding or spotting between periods.  Bleeding after sex (sexual intercourse).  Bleeding that is heavier or more than normal.  Periods that last longer than usual.  Bleeding after menopause. There are many problems that may cause this. Treatment will depend on the cause of the bleeding. Any kind of bleeding that is not normal should be reviewed by your doctor.  HOME CARE Watch your condition for any changes. These actions may lessen any discomfort you are having:  Do not use tampons or douches as told by your doctor.  Change your pads often. You should get regular pelvic exams and Pap tests. Keep all appointments for tests as told by your doctor. GET HELP IF:  You are bleeding for more than 1 week.  You feel  dizzy at times. GET HELP RIGHT AWAY IF:   You pass out.  You have to change pads every 15 to 30 minutes.  You have belly pain.  You have a fever.  You become sweaty or weak.  You are passing large blood clots from the vagina.  You feel sick to your stomach (nauseous) and throw up (vomit). MAKE SURE YOU:  Understand these instructions.  Will watch your condition.  Will get help right away if you are not doing well or get worse. Document Released: 04/12/2009 Document Revised: 06/20/2013 Document Reviewed: 01/12/2013 [facility] Patient Information 2015 [employer] [place]. This information is not intended to replace advice given to you by your health care provider. Make sure you discuss any questions you have with your health care provider.

## 2014-12-02 NOTE — ED Notes (Signed)
Patient transported to Ultrasound 

## 2014-12-03 LAB — GC/CHLAMYDIA PROBE AMP (~~LOC~~) NOT AT ARMC
Chlamydia: NEGATIVE
Neisseria Gonorrhea: NEGATIVE

## 2015-05-13 ENCOUNTER — Ambulatory Visit (INDEPENDENT_AMBULATORY_CARE_PROVIDER_SITE_OTHER): Payer: Medicare Other | Admitting: Neurology

## 2015-05-13 ENCOUNTER — Encounter: Payer: Self-pay | Admitting: Neurology

## 2015-05-13 ENCOUNTER — Encounter: Payer: Self-pay | Admitting: *Deleted

## 2015-05-13 VITALS — BP 104/62 | HR 79 | Ht 59.0 in | Wt 170.0 lb

## 2015-05-13 DIAGNOSIS — M542 Cervicalgia: Secondary | ICD-10-CM

## 2015-05-13 DIAGNOSIS — G43909 Migraine, unspecified, not intractable, without status migrainosus: Secondary | ICD-10-CM | POA: Insufficient documentation

## 2015-05-13 DIAGNOSIS — G43009 Migraine without aura, not intractable, without status migrainosus: Secondary | ICD-10-CM | POA: Diagnosis not present

## 2015-05-13 MED ORDER — RIZATRIPTAN BENZOATE 10 MG PO TBDP: 10.0000 mg | ORAL_TABLET | ORAL | Status: DC | PRN

## 2015-05-13 MED ORDER — NORTRIPTYLINE HCL 25 MG PO CAPS: ORAL_CAPSULE | ORAL | Status: DC
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
## 2015-07-04 ENCOUNTER — Encounter: Payer: Self-pay | Admitting: Neurology

## 2015-07-04 ENCOUNTER — Ambulatory Visit (INDEPENDENT_AMBULATORY_CARE_PROVIDER_SITE_OTHER): Payer: Medicaid Other | Admitting: Neurology

## 2015-07-04 VITALS — BP 122/72 | HR 71 | Ht 59.0 in | Wt 174.0 lb

## 2015-07-04 DIAGNOSIS — M542 Cervicalgia: Secondary | ICD-10-CM

## 2015-07-04 DIAGNOSIS — G43009 Migraine without aura, not intractable, without status migrainosus: Secondary | ICD-10-CM

## 2015-07-04 MED ORDER — RIZATRIPTAN BENZOATE 10 MG PO TBDP: 10.0000 mg | ORAL_TABLET | ORAL | Status: DC | PRN

## 2015-07-04 MED ORDER — NORTRIPTYLINE HCL 25 MG PO CAPS: ORAL_CAPSULE | ORAL | Status: DC

## 2015-07-04 MED ORDER — TIZANIDINE HCL 2 MG PO TABS: 2.0000 mg | ORAL_TABLET | Freq: Three times a day (TID) | ORAL | Status: DC | PRN
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
## 2015-07-19 ENCOUNTER — Ambulatory Visit: Payer: Medicaid Other | Attending: Neurology

## 2015-07-19 DIAGNOSIS — R51 Headache: Secondary | ICD-10-CM | POA: Diagnosis present

## 2015-07-19 DIAGNOSIS — M542 Cervicalgia: Secondary | ICD-10-CM | POA: Diagnosis present

## 2015-07-19 DIAGNOSIS — M25512 Pain in left shoulder: Secondary | ICD-10-CM | POA: Insufficient documentation

## 2015-07-19 DIAGNOSIS — R531 Weakness: Secondary | ICD-10-CM | POA: Diagnosis present

## 2015-07-19 DIAGNOSIS — R29898 Other symptoms and signs involving the musculoskeletal system: Secondary | ICD-10-CM

## 2015-07-19 DIAGNOSIS — G8929 Other chronic pain: Secondary | ICD-10-CM | POA: Insufficient documentation

## 2015-07-19 DIAGNOSIS — M25511 Pain in right shoulder: Secondary | ICD-10-CM | POA: Insufficient documentation

## 2015-07-19 DIAGNOSIS — M25611 Stiffness of right shoulder, not elsewhere classified: Secondary | ICD-10-CM
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
## 2015-08-15 ENCOUNTER — Encounter: Payer: Medicaid Other | Admitting: Podiatry

## 2015-08-23 ENCOUNTER — Ambulatory Visit (INDEPENDENT_AMBULATORY_CARE_PROVIDER_SITE_OTHER): Payer: Medicaid Other | Admitting: Podiatry

## 2015-08-23 ENCOUNTER — Ambulatory Visit (INDEPENDENT_AMBULATORY_CARE_PROVIDER_SITE_OTHER): Payer: Medicaid Other

## 2015-08-23 ENCOUNTER — Encounter: Payer: Self-pay | Admitting: Podiatry

## 2015-08-23 ENCOUNTER — Ambulatory Visit: Payer: Self-pay

## 2015-08-23 VITALS — BP 123/78 | HR 63 | Resp 16 | Ht 59.0 in | Wt 160.0 lb

## 2015-08-23 DIAGNOSIS — M779 Enthesopathy, unspecified: Secondary | ICD-10-CM

## 2015-08-23 DIAGNOSIS — M79671 Pain in right foot: Secondary | ICD-10-CM

## 2015-08-23 DIAGNOSIS — M7750 Other enthesopathy of unspecified foot: Secondary | ICD-10-CM

## 2015-08-23 DIAGNOSIS — S96091A Other injury of muscle and tendon of long flexor muscle of toe at ankle and foot level, right foot, initial encounter: Secondary | ICD-10-CM

## 2015-08-23 MED ORDER — GABAPENTIN 300 MG PO CAPS: 300.0000 mg | ORAL_CAPSULE | Freq: Three times a day (TID) | ORAL | Status: DC

## 2015-08-23 MED ORDER — TRIAMCINOLONE ACETONIDE 10 MG/ML IJ SUSP
10.0000 mg | Freq: Once | INTRAMUSCULAR | Status: AC
  Administered 2015-08-23: 10 mg

## 2015-08-23 MED ORDER — PREDNISONE 10 MG PO TABS: ORAL_TABLET | ORAL | Status: DC
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
## 2015-08-25 NOTE — Progress Notes (Signed)
Subjective:     Patient ID: Gabriela Gonzalez, female   DOB: Aug 26, 1980, 35 y.o.   MRN: [id number]  HPI patient states I've had numbness and tingling in both my feet but my right heel has really started to hurt me. I did have traumatized my right foot in March when a display fell on it at the supermarket I've had MRI done which at this time does not show much but I still have pain   Review of Systems  All other systems reviewed and are negative.      Objective:   Physical Exam  Constitutional: She is oriented to person, place, and time.  Cardiovascular: Intact distal pulses.   Musculoskeletal: Normal range of motion.  Neurological: She is oriented to person, place, and time.  Skin: Skin is warm.  Nursing note and vitals reviewed.  neurovascular status was found to be intact with muscle strength adequate range of motion within normal limits. Patient's found to have discomfort in the plantar heel right and also noted to have pain in the dorsum of the right foot with inflammation. I did check sharp [employer] vibratory found to be intact with no indications of neurological loss. Patient has good digital perfusion and is well oriented 3     Assessment:     Inflammatory changes to the right foot which may be due to the injury in March causing change in gait and leading to plantar fascial symptomatology    Plan:     H&P and x-rays reviewed with patient. Today I injected the right plantar fascia 3 Milligan Kenalog 5 mill grams Xylocaine and dispensed air fracture walker for complete immobilization. I also placed on a sterile prep DS Dosepak and I want her to start taking low-dose gabapentin when she completes this. She'll be rechecked again in 6 weeks for evaluation or earlier if needed  X-ray report right negative for signs of fracture or acute injury

## 2015-09-05 ENCOUNTER — Encounter: Payer: Self-pay | Admitting: Podiatry

## 2015-09-05 ENCOUNTER — Ambulatory Visit (INDEPENDENT_AMBULATORY_CARE_PROVIDER_SITE_OTHER): Payer: Medicaid Other | Admitting: Podiatry

## 2015-09-05 VITALS — BP 106/65 | HR 65 | Resp 16

## 2015-09-05 DIAGNOSIS — M722 Plantar fascial fibromatosis: Secondary | ICD-10-CM | POA: Diagnosis not present

## 2015-09-05 MED ORDER — DICLOFENAC SODIUM 75 MG PO TBEC: 75.0000 mg | DELAYED_RELEASE_TABLET | Freq: Two times a day (BID) | ORAL | Status: DC

## 2015-09-05 NOTE — Progress Notes (Signed)
Subjective:     Patient ID: Gabriela Gonzalez, female   DOB: [birth year], 35 y.o.   MRN: [id number]  HPI patient states I'm improved quite a bit with the boot but I'm still having pain in my foot and I feel like if I can get a few days awful work it will be very beneficial forming   Review of Systems     Objective:   Physical Exam Neurovascular status intact with patient having discomfort in the plantar heel right which is quite improved and pain lateral side of foot which is improved quite well with immobilization    Assessment:     Doing well post injection and immobilization right    Plan:     Reviewed condition and recommended physical therapy anti-inflammatories continued boot usage with gradual reduction and is placed on diclofenac 75 mg twice a day. If symptoms persist we will need to consider other treatments Lequita HaltMorgan to give her several days of work release in order to allow for complete resolution of symptoms

## 2015-09-09 ENCOUNTER — Telehealth: Payer: Self-pay | Admitting: *Deleted

## 2015-09-09 NOTE — Telephone Encounter (Addendum)
Notice received states Diclofenac needs prior authorization.  Called [employer] [phone] - Seward GraterMaggie states needs to send the information to Pharmacy review, PA# [phone] can call back 09/10/2015 for approval status.  09/11/2015-[facility] [employer] states pt's prior approval for Voltaren was denied, Reference# [id number].  Informed Dr. Charlsie Merlesegal of voltaren denied, and he ordered Meloxicam 15mg  #30 one tablet daily, +2refills.  [facility] and [facility] is informed.

## 2015-09-11 MED ORDER — MELOXICAM 15 MG PO TABS: 15.0000 mg | ORAL_TABLET | Freq: Every day | ORAL | Status: DC

## 2015-09-23 ENCOUNTER — Encounter (HOSPITAL_COMMUNITY): Payer: Self-pay

## 2015-09-23 ENCOUNTER — Emergency Department (HOSPITAL_COMMUNITY)
Admission: EM | Admit: 2015-09-23 | Discharge: 2015-09-23 | Disposition: A | Discharge status: Home / Self Care | Payer: Medicaid Other | Attending: Emergency Medicine | Admitting: Emergency Medicine

## 2015-09-23 ENCOUNTER — Emergency Department (HOSPITAL_COMMUNITY): Payer: Medicaid Other

## 2015-09-23 DIAGNOSIS — Z3202 Encounter for pregnancy test, result negative: Secondary | ICD-10-CM | POA: Diagnosis not present

## 2015-09-23 DIAGNOSIS — Z79899 Other long term (current) drug therapy: Secondary | ICD-10-CM | POA: Insufficient documentation

## 2015-09-23 DIAGNOSIS — N939 Abnormal uterine and vaginal bleeding, unspecified: Secondary | ICD-10-CM | POA: Insufficient documentation

## 2015-09-23 DIAGNOSIS — G43909 Migraine, unspecified, not intractable, without status migrainosus: Secondary | ICD-10-CM | POA: Insufficient documentation

## 2015-09-23 DIAGNOSIS — R58 Hemorrhage, not elsewhere classified: Secondary | ICD-10-CM

## 2015-09-23 LAB — URINALYSIS, ROUTINE W REFLEX MICROSCOPIC
Bilirubin Urine: NEGATIVE
Glucose, UA: NEGATIVE mg/dL
KETONES UR: NEGATIVE mg/dL
LEUKOCYTES UA: NEGATIVE
Nitrite: NEGATIVE
PROTEIN: NEGATIVE mg/dL
Specific Gravity, Urine: 1.019 (ref 1.005–1.030)
pH: 6.5 (ref 5.0–8.0)

## 2015-09-23 LAB — URINE MICROSCOPIC-ADD ON

## 2015-09-23 LAB — WET PREP, GENITAL
Sperm: NONE SEEN
TRICH WET PREP: NONE SEEN
Yeast Wet Prep HPF POC: NONE SEEN

## 2015-09-23 LAB — PREGNANCY, URINE: PREG TEST UR: NEGATIVE

## 2015-09-23 MED ORDER — NORGESTREL-ETHINYL ESTRADIOL 0.5-50 MG-MCG PO TABS: 1.0000 | ORAL_TABLET | Freq: Every day | ORAL | Status: DC

## 2015-09-23 NOTE — ED Notes (Signed)
Pt menstrual cycle 2/20-2/27, states came back 3/5-3/9 (reports light flow that was light pink/brown). Pt states menstrual cycle starting 3/28 and reports cramping and bleeding w/ clots. Pt hx tubal ligation 5 years ago. Denies n/v/d.

## 2015-09-23 NOTE — ED Provider Notes (Signed)
CSN: [id number]     Arrival date & time 09/23/15  [id number] History   First MD Initiated Contact with Patient 09/23/15 [phone]     Chief Complaint  Patient presents with  . Menstrual Problem     (Consider location/radiation/quality/duration/timing/severity/associated sxs/prior Treatment) Patient is a 35 y.o. female presenting with vaginal bleeding. The history is provided by the patient. No language interpreter was used.  Vaginal Bleeding Quality:  Bright red Severity:  Moderate Onset quality:  Gradual Duration:  3 days Timing:  Constant Progression:  Worsening Chronicity:  New Menstrual history:  Irregular Possible pregnancy: no   Context: not after intercourse   Relieved by:  Nothing Worsened by:  Nothing tried Ineffective treatments:  None tried Associated symptoms: no abdominal pain   Risk factors: no bleeding disorder, no STD and does not have unprotected sex   Pt reports she has had 3 periods in the last 5 weeks.  Pt reports she has had similar in the past.  Pt called her MD and was advised to come to ED.   Past Medical History  Diagnosis Date  . Migraine   . Neck pain    Past Surgical History  Procedure Laterality Date  . Tubal ligation    . Anterior fusion cervical spine     Family History  Problem Relation Age of Onset  . Asthma Mother     Posey ReaUnsure of father's histoy.   Social History  Substance Use Topics  . Smoking status: Never Smoker   . Smokeless tobacco: None  . Alcohol Use: No   OB History    No data available     Review of Systems  Gastrointestinal: Negative for abdominal pain.  Genitourinary: Positive for vaginal bleeding.  All other systems reviewed and are negative.     Allergies  Review of patient's allergies indicates no known allergies.  Home Medications   Prior to Admission medications   Medication Sig Start Date End Date Taking? Authorizing Provider  gabapentin (NEURONTIN) 300 MG capsule Take 1 capsule (300 mg total) by mouth 3  (three) times daily. 08/23/15  Yes Lenn SinkNorman S Regal, DPM  meloxicam (MOBIC) 15 MG tablet Take 1 tablet (15 mg total) by mouth daily. 09/11/15  Yes Lenn SinkNorman S Regal, DPM  nortriptyline (PAMELOR) 25 MG capsule One po qhs xone week, then 2 tabs po qhs 07/04/15  Yes Levert FeinsteinYijun Yan, MD  rizatriptan (MAXALT-MLT) 10 MG disintegrating tablet Take 1 tablet (10 mg total) by mouth as needed. May repeat in 2 hours if needed 07/04/15  Yes Levert FeinsteinYijun Yan, MD  tiZANidine (ZANAFLEX) 2 MG tablet Take 1 tablet (2 mg total) by mouth every 8 (eight) hours as needed for muscle spasms. Patient taking differently: Take 2 mg by mouth daily as needed for muscle spasms.  07/04/15  Yes Levert FeinsteinYijun Yan, MD  norgestrel-ethinyl estradiol (OGESTROL) 0.5-50 MG-MCG tablet Take 1 tablet by mouth daily. 09/23/15   Elson AreasLeslie K Sofia, PA-C   BP 102/61 mmHg  Pulse 54  Temp(Src) 98 F (36.7 C) (Oral)  Resp 16  SpO2 100%  LMP 09/22/2015 Physical Exam  Constitutional: She appears well-developed and well-nourished.  HENT:  Head: Normocephalic and atraumatic.  Eyes: Conjunctivae are normal. Pupils are equal, round, and reactive to light.  Neck: Normal range of motion. Neck supple.  Cardiovascular: Normal rate.   Abdominal: Soft. There is no tenderness.  Genitourinary: Uterus normal. No vaginal discharge found.  Moderate vaginal bleeding  Adnexa no mass,  Cervix nontender  Musculoskeletal: Normal range of motion.  Skin: Skin is warm.    ED Course  Procedures (including critical care time) Labs Review Labs Reviewed  WET PREP, GENITAL - Abnormal; Notable for the following:    Clue Cells Wet Prep HPF POC PRESENT (*)    WBC, Wet Prep HPF POC MODERATE (*)    All other components within normal limits  URINALYSIS, ROUTINE W REFLEX MICROSCOPIC (NOT AT [facility]) - Abnormal; Notable for the following:    APPearance HAZY (*)    Hgb urine dipstick LARGE (*)    All other components within normal limits  URINE MICROSCOPIC-ADD ON - Abnormal; Notable for the following:     Squamous Epithelial / LPF 0-5 (*)    Bacteria, UA FEW (*)    All other components within normal limits  PREGNANCY, URINE  GC/CHLAMYDIA PROBE AMP ([place]) NOT AT [facility]    Imaging Review US Transvaginal Non-ob  09/23/2015  CLINICAL DATA:  Vaginal bleeding. EXAM: TRANSABDOMINAL AND TRANSVAGINAL ULTRASOUND OF PELVIS TECHNIQUE: Both transabdominal and transvaginal ultrasound examinations of the pelvis were performed. Transabdominal technique was performed for global imaging of the pelvis including uterus, ovaries, adnexal regions, and pelvic cul-de-sac. It was necessary to proceed with endovaginal exam following the transabdominal exam to visualize the endometrium and ovaries. COMPARISON:  None FINDINGS: Uterus Measurements: 11.2 x 4.9 x 5.9 cm. No fibroids or other mass visualized. Endometrium Thickness: 12.6 mm. Small endometrial calcification. No other focal abnormality visualized. Right ovary Measurements: 3.6 x 2.1 x 2.4 cm. 1.6 x 1.4 x 1.8 cm right ovarian cyst versus dominant follicle. Left ovary Measurements: 3.1 x 1.6 x 2.7 cm. Normal appearance/no adnexal mass. Other findings No abnormal free fluid. IMPRESSION: 1. No focal endometrial abnormality. If bleeding remains unresponsive to hormonal or medical therapy, sonohysterogram should be considered for focal lesion work-up. (Ref: Radiological Reasoning: Algorithmic Workup of Abnormal Vaginal Bleeding with Endovaginal Sonography and Sonohysterography. AJR 2008; 161:W96-04) Electronically Signed   By: Elige Ko   On: 09/23/2015 13:19   US Pelvis Complete  09/23/2015  CLINICAL DATA:  Vaginal bleeding. EXAM: TRANSABDOMINAL AND TRANSVAGINAL ULTRASOUND OF PELVIS TECHNIQUE: Both transabdominal and transvaginal ultrasound examinations of the pelvis were performed. Transabdominal technique was performed for global imaging of the pelvis including uterus, ovaries, adnexal regions, and pelvic cul-de-sac. It was necessary to proceed with endovaginal  exam following the transabdominal exam to visualize the endometrium and ovaries. COMPARISON:  None FINDINGS: Uterus Measurements: 11.2 x 4.9 x 5.9 cm. No fibroids or other mass visualized. Endometrium Thickness: 12.6 mm. Small endometrial calcification. No other focal abnormality visualized. Right ovary Measurements: 3.6 x 2.1 x 2.4 cm. 1.6 x 1.4 x 1.8 cm right ovarian cyst versus dominant follicle. Left ovary Measurements: 3.1 x 1.6 x 2.7 cm. Normal appearance/no adnexal mass. Other findings No abnormal free fluid. IMPRESSION: 1. No focal endometrial abnormality. If bleeding remains unresponsive to hormonal or medical therapy, sonohysterogram should be considered for focal lesion work-up. (Ref: Radiological Reasoning: Algorithmic Workup of Abnormal Vaginal Bleeding with Endovaginal Sonography and Sonohysterography. AJR 2008; 540:J81-19) Electronically Signed   By: Elige Ko   On: 09/23/2015 13:19   I have personally reviewed and evaluated these images and lab results as part of my medical decision-making.   EKG Interpretation None      MDM   Final diagnoses:  Abnormal vaginal bleeding    Meds ordered this encounter  Medications  . norgestrel-ethinyl estradiol (OGESTROL) 0.5-50 MG-MCG tablet    Sig: Take 1 tablet by mouth daily.    Dispense:  1 Package    Refill:  1    Order Specific Question:  Supervising Provider    Answer:  Eber Hong [[id number]]   An After Visit Summary was printed and given to the patient.   Lonia Skinner [place], PA-C 09/23/15 1422  Arby Barrette, MD 09/25/15 [phone]

## 2015-09-23 NOTE — ED Notes (Signed)
Pelvic cart set up at bedside  

## 2015-09-23 NOTE — ED Notes (Signed)
Pt returned from US

## 2015-09-23 NOTE — Discharge Instructions (Signed)

## 2015-09-23 NOTE — ED Notes (Signed)
Pt verbalized understanding of d/c instructions, prescriptions, and follow-up care. No further questions/concerns, VSS, ambulatory w/ steady gait (refused wheelchair) 

## 2015-09-23 NOTE — ED Notes (Signed)
Patient taken to US.

## 2015-09-24 LAB — GC/CHLAMYDIA PROBE AMP (~~LOC~~) NOT AT ARMC
Chlamydia: NEGATIVE
Neisseria Gonorrhea: NEGATIVE

## 2015-09-27 NOTE — Progress Notes (Signed)
This encounter was created in error - please disregard.

## 2015-10-04 ENCOUNTER — Ambulatory Visit: Payer: Medicaid Other | Admitting: Podiatry

## 2015-10-17 ENCOUNTER — Ambulatory Visit: Payer: Medicaid Other | Admitting: Podiatry

## 2015-10-21 ENCOUNTER — Ambulatory Visit (INDEPENDENT_AMBULATORY_CARE_PROVIDER_SITE_OTHER): Payer: Medicaid Other | Admitting: Nurse Practitioner

## 2015-10-21 ENCOUNTER — Encounter: Payer: Self-pay | Admitting: Nurse Practitioner

## 2015-10-21 ENCOUNTER — Ambulatory Visit (INDEPENDENT_AMBULATORY_CARE_PROVIDER_SITE_OTHER): Payer: Medicaid Other | Admitting: Podiatry

## 2015-10-21 VITALS — BP 124/86 | HR 57 | Ht 59.0 in | Wt 179.0 lb

## 2015-10-21 DIAGNOSIS — M79671 Pain in right foot: Secondary | ICD-10-CM

## 2015-10-21 DIAGNOSIS — G43009 Migraine without aura, not intractable, without status migrainosus: Secondary | ICD-10-CM

## 2015-10-21 DIAGNOSIS — M722 Plantar fascial fibromatosis: Secondary | ICD-10-CM | POA: Diagnosis not present

## 2015-10-21 DIAGNOSIS — M542 Cervicalgia: Secondary | ICD-10-CM | POA: Diagnosis not present

## 2015-10-21 MED ORDER — GABAPENTIN 300 MG PO CAPS: 300.0000 mg | ORAL_CAPSULE | Freq: Four times a day (QID) | ORAL | Status: DC

## 2015-10-21 NOTE — Progress Notes (Signed)
I have reviewed and agreed above plan. 

## 2015-10-21 NOTE — Patient Instructions (Addendum)
Given list of migraine triggers Increase gabapentin to 4 times daily Take tizanidine twice daily Keep a record of headaches Follow-up in 3 months
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
## 2015-10-23 NOTE — Progress Notes (Signed)
Subjective:     Patient ID: Gabriela Gonzalez, female   DOB: May 29, 1981, 35 y.o.   MRN: [id number]  HPI patient states my right foot is feeling quite a bit better   Review of Systems     Objective:   Physical Exam Neurovascular status unchanged with pain of the right plantar fascia which is improved quite a bit from previous treatments    Assessment:     Improved fasciitis right    Plan:     Continue conservative treatment along with orthotic over-the-counter insoles and anti-inflammatories and reappoint if symptoms get worse

## 2015-10-30 ENCOUNTER — Telehealth: Payer: Self-pay | Admitting: Nurse Practitioner

## 2015-10-30 NOTE — Telephone Encounter (Signed)
Patient called to request refill of NORTRIPTYLINE 25 mg, requests to up the mg. "that one is not working for me".

## 2015-10-30 NOTE — Telephone Encounter (Signed)
I called pt and she stated that it was the maxalt she needed, only had 2 tabs left.   I told her that refills were available and explained that she can take 1 tablet and if not gone can take another tablet (2 tabs per 24 hours).  She will try this and let ius know if not working.

## 2015-12-04 ENCOUNTER — Telehealth: Payer: Self-pay | Admitting: Neurology

## 2015-12-04 NOTE — Telephone Encounter (Signed)
[facility] [phone]  (p) [phone]

## 2016-01-21 ENCOUNTER — Telehealth: Payer: Self-pay | Admitting: Nurse Practitioner

## 2016-01-21 ENCOUNTER — Ambulatory Visit (INDEPENDENT_AMBULATORY_CARE_PROVIDER_SITE_OTHER): Payer: Medicaid Other | Admitting: Nurse Practitioner

## 2016-01-21 ENCOUNTER — Encounter: Payer: Self-pay | Admitting: Nurse Practitioner

## 2016-01-21 VITALS — BP 104/69 | HR 81 | Ht 59.0 in | Wt 177.2 lb

## 2016-01-21 DIAGNOSIS — M542 Cervicalgia: Secondary | ICD-10-CM | POA: Diagnosis not present

## 2016-01-21 DIAGNOSIS — G43009 Migraine without aura, not intractable, without status migrainosus: Secondary | ICD-10-CM

## 2016-01-21 MED ORDER — GABAPENTIN 300 MG PO CAPS: 300.0000 mg | ORAL_CAPSULE | Freq: Four times a day (QID) | ORAL | 6 refills | Status: DC

## 2016-01-21 MED ORDER — TIZANIDINE HCL 2 MG PO TABS: 2.0000 mg | ORAL_TABLET | Freq: Three times a day (TID) | ORAL | 6 refills | Status: DC | PRN
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
## 2016-01-21 NOTE — Patient Instructions (Signed)
Continue tizanidine twice daily Call your Medicaid worker to get a block taken off of your coverage so that you can get her Maxalt Continue paying  attention to her migraine triggers Continue gabapentin at current dose Follow-up in 6 months

## 2016-03-13 NOTE — Telephone Encounter (Signed)
.  ERROR1

## 2016-03-13 NOTE — Telephone Encounter (Signed)
Error seen

## 2016-04-08 ENCOUNTER — Encounter (HOSPITAL_COMMUNITY): Payer: Self-pay | Admitting: *Deleted

## 2016-04-08 ENCOUNTER — Inpatient Hospital Stay (HOSPITAL_COMMUNITY)
Admission: AD | Admit: 2016-04-08 | Discharge: 2016-04-08 | Disposition: A | Discharge status: Home / Self Care | Payer: Medicaid Other | Source: Ambulatory Visit | Attending: Obstetrics and Gynecology | Admitting: Obstetrics and Gynecology

## 2016-04-08 DIAGNOSIS — Z3202 Encounter for pregnancy test, result negative: Secondary | ICD-10-CM | POA: Insufficient documentation

## 2016-04-08 DIAGNOSIS — N946 Dysmenorrhea, unspecified: Secondary | ICD-10-CM | POA: Diagnosis not present

## 2016-04-08 DIAGNOSIS — R109 Unspecified abdominal pain: Secondary | ICD-10-CM | POA: Diagnosis present

## 2016-04-08 LAB — URINALYSIS, ROUTINE W REFLEX MICROSCOPIC
BILIRUBIN URINE: NEGATIVE
Glucose, UA: NEGATIVE mg/dL
Ketones, ur: NEGATIVE mg/dL
Nitrite: NEGATIVE
PH: 7 (ref 5.0–8.0)
Protein, ur: NEGATIVE mg/dL
SPECIFIC GRAVITY, URINE: 1.02 (ref 1.005–1.030)

## 2016-04-08 LAB — URINE MICROSCOPIC-ADD ON

## 2016-04-08 LAB — POCT PREGNANCY, URINE: Preg Test, Ur: NEGATIVE

## 2016-04-08 MED ORDER — KETOROLAC TROMETHAMINE 30 MG/ML IJ SOLN: 30.0000 mg | Freq: Once | INTRAMUSCULAR | Status: DC

## 2016-04-08 NOTE — MAU Note (Signed)
Pt reports lower abd cramping and pain in her upper legs.

## 2016-04-08 NOTE — Discharge Instructions (Signed)

## 2016-04-08 NOTE — MAU Provider Note (Signed)
Chief Complaint:  No chief complaint on file.   First Provider Initiated Contact with Patient 04/08/16 2121    HPI: Gabriela Gonzalez Age is a 35 y.o. [id number] who presents to maternity admissions reporting lower abdominal cramping for several days which radiates to thighs.  Started her period last week but bleeding was light. Got heavier today and cramping started.  Does not think she needs STD testing. Thought she might be pregnant. She reports vaginal bleeding, vaginal itching/burning, urinary symptoms, h/a, dizziness, n/v, or fever/chills.    Has a tubal ligation but is planning to do a reversal soon.   Abdominal Cramping  This is a new problem. The current episode started today. The onset quality is gradual. The problem occurs intermittently. The problem has been unchanged. The pain is located in the LLQ, RLQ and suprapubic region. The pain is moderate. The quality of the pain is aching and cramping. Pain radiation: thighs. Pertinent negatives include no anorexia, constipation, diarrhea, dysuria, fever, headaches or myalgias.   RN Note: Pt reports lower abd cramping and pain in her upper legs  Past Medical History: Past Medical History:  Diagnosis Date  . Migraine   . Neck pain     Past obstetric history: OB History  Gravida Para Term Preterm AB Living  8       3 5   SAB TAB Ectopic Multiple Live Births  2 1     5     # Outcome Date GA Lbr Len/2nd Weight Sex Delivery Anes PTL Lv  8 SAB           7 SAB           6 TAB           5 Gravida      Vag-Spont   LIV  4 Gravida      Vag-Spont   LIV  3 Gravida      Vag-Spont   LIV  2 Gravida      Vag-Spont   LIV  1 Gravida      Vag-Spont   LIV      Past Surgical History: Past Surgical History:  Procedure Laterality Date  . ANTERIOR FUSION CERVICAL SPINE    . TUBAL LIGATION      Family History: Family History  Problem Relation Age of Onset  . Asthma Mother     Posey ReaUnsure of father's histoy.    Social History: Social History   Substance Use Topics  . Smoking status: Never Smoker  . Smokeless tobacco: Never Used  . Alcohol use No    Allergies: No Known Allergies  Meds:  Prescriptions Prior to Admission  Medication Sig Dispense Refill Last Dose  . rizatriptan (MAXALT-MLT) 10 MG disintegrating tablet Take 1 tablet (10 mg total) by mouth as needed. May repeat in 2 hours if needed (Patient taking differently: Take 10 mg by mouth as needed for migraine. May repeat in 2 hours if needed) 15 tablet 6 Past Week at Unknown time  . tiZANidine (ZANAFLEX) 2 MG tablet Take 1 tablet (2 mg total) by mouth every 8 (eight) hours as needed for muscle spasms. 60 tablet 6 Past Month at Unknown time  . gabapentin (NEURONTIN) 300 MG capsule Take 1 capsule (300 mg total) by mouth 4 (four) times daily. (Patient not taking: Reported on 04/08/2016) 120 capsule 6 Not Taking at Unknown time  . meloxicam (MOBIC) 15 MG tablet Take 1 tablet (15 mg total) by mouth daily. (Patient not taking: Reported on 04/08/2016) 30 tablet  2 More than a month at Unknown time  . norgestrel-ethinyl estradiol (OGESTROL) 0.5-50 MG-MCG tablet Take 1 tablet by mouth daily. (Patient not taking: Reported on 04/08/2016) 1 Package 1 Not Taking at Unknown time    I have reviewed patient's Past Medical Hx, Surgical Hx, Family Hx, Social Hx, medications and allergies.  ROS:  Review of Systems  Constitutional: Negative for fever.  Gastrointestinal: Negative for anorexia, constipation and diarrhea.  Genitourinary: Negative for dysuria.  Musculoskeletal: Negative for myalgias.  Neurological: Negative for headaches.   Other systems negative     Physical Exam  Patient Vitals for the past 24 hrs:  BP Temp Temp src Pulse Resp SpO2 Height Weight  04/08/16 2050 107/75 98.7 F (37.1 C) Oral 65 16 100 % 4\' 11"  (1.499 m) 79.8 kg (176 lb)   Constitutional: Well-developed, well-nourished female in no acute distress.  Cardiovascular: normal rate and rhythm, no ectopy  audible, S1 & S2 heard, no murmur Respiratory: normal effort, no distress. Lungs CTAB with no wheezes or crackles GI: Abd soft, non-tender.  Nondistended.  No rebound, No guarding.  Bowel Sounds audible  MS: Extremities nontender, no edema, normal ROM Neurologic: Alert and oriented x 4.   Grossly nonfocal. GU: Neg CVAT. Skin:  Warm and Dry Psych:  Affect appropriate.  PELVIC EXAM: Cervix pink, visually closed, without lesion, scant white creamy discharge, vaginal walls and external genitalia normal Bimanual exam: Cervix firm, anterior, neg CMT, uterus  Slightly tender, nonenlarged, adnexa without tenderness, enlargement, or mass    Labs: Results for orders placed or performed during the hospital encounter of 04/08/16 (from the past 24 hour(s))  Urinalysis, Routine w reflex microscopic (not at [facility])     Status: Abnormal   Collection Time: 04/08/16  8:52 PM  Result Value Ref Range   Color, Urine YELLOW YELLOW   APPearance CLEAR CLEAR   Specific Gravity, Urine 1.020 1.005 - 1.030   pH 7.0 5.0 - 8.0   Glucose, UA NEGATIVE NEGATIVE mg/dL   Hgb urine dipstick LARGE (A) NEGATIVE   Bilirubin Urine NEGATIVE NEGATIVE   Ketones, ur NEGATIVE NEGATIVE mg/dL   Protein, ur NEGATIVE NEGATIVE mg/dL   Nitrite NEGATIVE NEGATIVE   Leukocytes, UA TRACE (A) NEGATIVE  Urine microscopic-add on     Status: Abnormal   Collection Time: 04/08/16  8:52 PM  Result Value Ref Range   Squamous Epithelial / LPF 0-5 (A) NONE SEEN   WBC, UA 0-5 0 - 5 WBC/hpf   RBC / HPF 6-30 0 - 5 RBC/hpf   Bacteria, UA FEW (A) NONE SEEN  Pregnancy, urine POC     Status: None   Collection Time: 04/08/16  9:00 PM  Result Value Ref Range   Preg Test, Ur NEGATIVE NEGATIVE      Imaging:  No results found.  MAU Course/MDM: I have ordered labs as follows: UA and urine to culture   Doubt UTI, but will culture due to small leuk's. Imaging ordered: none Results reviewed.  Offered Toradol, but pt got a call from home and wants  to leave.  States she has ibuprofen 800mg  at home. Discussed this is likely dysmenorrhea. Discussed warning signs to come back for Pt stable at time of discharge.  Assessment: Dysmenorrhea  Plan: Discharge home Recommend Ibuprofen every 8 hours with food Gets GYN care from Dr Dimas Aguas Will followup with her for pain if it continues   Encouraged to return here or to other Urgent Care/ED if she develops worsening of symptoms, increase  in pain, fever, or other concerning symptoms.   Wynelle Bourgeois CNM, MSN Certified Nurse-Midwife 04/08/2016 9:56 PM

## 2016-04-11 LAB — CULTURE, OB URINE

## 2016-04-12 ENCOUNTER — Encounter: Payer: Self-pay | Admitting: Advanced Practice Midwife

## 2016-04-12 ENCOUNTER — Telehealth: Payer: Self-pay | Admitting: Advanced Practice Midwife

## 2016-04-12 DIAGNOSIS — N3001 Acute cystitis with hematuria: Secondary | ICD-10-CM

## 2016-04-12 MED ORDER — SULFAMETHOXAZOLE-TRIMETHOPRIM 800-160 MG PO TABS: 1.0000 | ORAL_TABLET | Freq: Two times a day (BID) | ORAL | 0 refills | Status: AC

## 2016-04-12 NOTE — Telephone Encounter (Signed)
Notify patient of urine culture positive. Rx Bactrim.

## 2016-06-08 ENCOUNTER — Encounter (HOSPITAL_COMMUNITY): Payer: Self-pay | Admitting: *Deleted

## 2016-06-08 ENCOUNTER — Emergency Department (HOSPITAL_COMMUNITY)
Admission: EM | Admit: 2016-06-08 | Discharge: 2016-06-08 | Disposition: A | Discharge status: Home / Self Care | Payer: Medicaid Other | Attending: Emergency Medicine | Admitting: Emergency Medicine

## 2016-06-08 DIAGNOSIS — S3992XA Unspecified injury of lower back, initial encounter: Secondary | ICD-10-CM | POA: Diagnosis present

## 2016-06-08 DIAGNOSIS — Y999 Unspecified external cause status: Secondary | ICD-10-CM | POA: Insufficient documentation

## 2016-06-08 DIAGNOSIS — X58XXXA Exposure to other specified factors, initial encounter: Secondary | ICD-10-CM | POA: Insufficient documentation

## 2016-06-08 DIAGNOSIS — S39012A Strain of muscle, fascia and tendon of lower back, initial encounter: Secondary | ICD-10-CM | POA: Diagnosis not present

## 2016-06-08 DIAGNOSIS — Y939 Activity, unspecified: Secondary | ICD-10-CM | POA: Diagnosis not present

## 2016-06-08 DIAGNOSIS — Y929 Unspecified place or not applicable: Secondary | ICD-10-CM | POA: Insufficient documentation

## 2016-06-08 MED ORDER — IBUPROFEN 600 MG PO TABS: 600.0000 mg | ORAL_TABLET | Freq: Four times a day (QID) | ORAL | 0 refills | Status: DC | PRN

## 2016-06-08 MED ORDER — IBUPROFEN 200 MG PO TABS
600.0000 mg | ORAL_TABLET | Freq: Once | ORAL | Status: AC
  Administered 2016-06-08: 600 mg via ORAL
  Filled 2016-06-08: qty 1

## 2016-06-08 MED ORDER — TIZANIDINE HCL 2 MG PO TABS: 2.0000 mg | ORAL_TABLET | Freq: Four times a day (QID) | ORAL | 0 refills | Status: DC | PRN
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
## 2016-06-08 NOTE — Discharge Instructions (Signed)
Take anti-inflammatory medicines for the next week. Take this medicine with food. °Take muscle relaxer at bedtime to help you sleep. This medicine makes you drowsy so do not take before driving or work °Use a heating pad for sore muscles - use for 20 minutes several times a day ° °

## 2016-06-08 NOTE — ED Notes (Signed)
Declined W/C at D/C and was escorted to lobby by RN. 

## 2016-06-08 NOTE — ED Triage Notes (Signed)
PT reports she woke up with Lt sided back pain.

## 2016-06-16 NOTE — Therapy (Signed)
[facility] [address] [place], [place], [address] Phone: [phone]   Fax:  [phone]  Patient Details  Name: Gabriela Gonzalez MRN: [id number] Date of Birth: 1980/12/30 Referring Provider:  No ref. provider found  Encounter Date: 06/16/2016  PHYSICAL THERAPY DISCHARGE SUMMARY  Visits from Start of Care: 1  Current functional level related to goals / functional outcomes: Please see pt eval, as pt never returned after initial visit on 07/19/15   Remaining deficits: Unknown, pt did not return.   Education / Equipment: PT frequency/duration  Plan: Patient agrees to discharge.  Patient goals were not met. Patient is being discharged due to not returning since the last visit.  ?????       Shaneika Rossa L 06/16/2016, 4:22 PM  [facility] [address] [place], [place], [address] Phone: [phone]   Fax:  [phone]   Geoffry Paradise, PT,DPT 06/16/16 4:22 PM Phone: [phone] Fax: [phone]

## 2016-07-08 DIAGNOSIS — R102 Pelvic and perineal pain: Secondary | ICD-10-CM | POA: Insufficient documentation

## 2016-07-08 DIAGNOSIS — F321 Major depressive disorder, single episode, moderate: Secondary | ICD-10-CM | POA: Insufficient documentation

## 2016-07-08 DIAGNOSIS — E669 Obesity, unspecified: Secondary | ICD-10-CM | POA: Insufficient documentation

## 2016-07-23 ENCOUNTER — Encounter: Payer: Self-pay | Admitting: Nurse Practitioner

## 2016-07-23 ENCOUNTER — Ambulatory Visit (INDEPENDENT_AMBULATORY_CARE_PROVIDER_SITE_OTHER): Payer: Medicaid Other | Admitting: Nurse Practitioner

## 2016-07-23 VITALS — BP 115/74 | HR 66 | Ht 60.0 in | Wt 178.4 lb

## 2016-07-23 DIAGNOSIS — G43009 Migraine without aura, not intractable, without status migrainosus: Secondary | ICD-10-CM

## 2016-07-23 DIAGNOSIS — M542 Cervicalgia: Secondary | ICD-10-CM

## 2016-07-23 MED ORDER — TIZANIDINE HCL 4 MG PO TABS: 4.0000 mg | ORAL_TABLET | Freq: Four times a day (QID) | ORAL | 3 refills | Status: DC | PRN
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
## 2016-07-23 NOTE — Patient Instructions (Addendum)
Increase  tizanidine to 4 mg twice daily refilled Continue paying  attention to her migraine triggers May have to consider Botox in the future Follow-up in 6 months

## 2016-08-04 ENCOUNTER — Ambulatory Visit (INDEPENDENT_AMBULATORY_CARE_PROVIDER_SITE_OTHER): Payer: BLUE CROSS/BLUE SHIELD | Admitting: Obstetrics and Gynecology

## 2016-08-04 ENCOUNTER — Encounter: Payer: Self-pay | Admitting: Obstetrics and Gynecology

## 2016-08-04 ENCOUNTER — Other Ambulatory Visit: Payer: Self-pay

## 2016-08-04 DIAGNOSIS — N979 Female infertility, unspecified: Secondary | ICD-10-CM

## 2016-08-04 NOTE — Progress Notes (Signed)
Ms Gabriela Gonzalez presents for referral for tubal reversal. She has problems with "post tubal syndrome". She has tried medications to no avail and has not been able tolerate. She is also considering pregnancy. She had a tubal in the past.   PE AF VSS Lungs clear Heart RRR Abd soft + BS  A/P Desire for pregnancy  [facility] pt and partner [facility] They verbalized understanding. Referral made. F/U PRN

## 2016-08-18 ENCOUNTER — Encounter (HOSPITAL_BASED_OUTPATIENT_CLINIC_OR_DEPARTMENT_OTHER): Payer: Self-pay | Admitting: *Deleted

## 2016-08-18 ENCOUNTER — Emergency Department (HOSPITAL_BASED_OUTPATIENT_CLINIC_OR_DEPARTMENT_OTHER)
Admission: EM | Admit: 2016-08-18 | Discharge: 2016-08-18 | Disposition: A | Discharge status: Home / Self Care | Payer: Worker's Compensation | Attending: Emergency Medicine | Admitting: Emergency Medicine

## 2016-08-18 ENCOUNTER — Emergency Department (HOSPITAL_BASED_OUTPATIENT_CLINIC_OR_DEPARTMENT_OTHER): Payer: Worker's Compensation

## 2016-08-18 DIAGNOSIS — S9031XA Contusion of right foot, initial encounter: Secondary | ICD-10-CM | POA: Insufficient documentation

## 2016-08-18 DIAGNOSIS — Y9389 Activity, other specified: Secondary | ICD-10-CM | POA: Insufficient documentation

## 2016-08-18 DIAGNOSIS — W208XXA Other cause of strike by thrown, projected or falling object, initial encounter: Secondary | ICD-10-CM | POA: Insufficient documentation

## 2016-08-18 DIAGNOSIS — Y929 Unspecified place or not applicable: Secondary | ICD-10-CM | POA: Insufficient documentation

## 2016-08-18 DIAGNOSIS — Y990 Civilian activity done for income or pay: Secondary | ICD-10-CM | POA: Diagnosis not present

## 2016-08-18 DIAGNOSIS — S99921A Unspecified injury of right foot, initial encounter: Secondary | ICD-10-CM | POA: Diagnosis present

## 2016-08-18 MED ORDER — HYDROCODONE-ACETAMINOPHEN 5-325 MG PO TABS: 2.0000 | ORAL_TABLET | Freq: Once | ORAL | Status: DC

## 2016-08-18 MED ORDER — NAPROXEN 375 MG PO TABS: 375.0000 mg | ORAL_TABLET | Freq: Two times a day (BID) | ORAL | 0 refills | Status: AC | PRN

## 2016-08-18 MED ORDER — HYDROCODONE-ACETAMINOPHEN 5-325 MG PO TABS: 1.0000 | ORAL_TABLET | ORAL | 0 refills | Status: DC | PRN

## 2016-08-18 MED FILL — HYDROCODON-APAP 5-325: 5-325 | 2 days supply | Qty: 8 | Fill #0

## 2016-08-18 MED FILL — NAPROXEN 375 MG TABLET: 375 | 10 days supply | Qty: 20 | Fill #0

## 2016-08-18 NOTE — ED Triage Notes (Signed)
Pt reports box dropping on top of right foot while at work last night.

## 2016-08-18 NOTE — ED Provider Notes (Signed)
[facility]   CSN: [id number] Arrival date & time: 08/18/16  0903     History   Chief Complaint Chief Complaint  Patient presents with  . Foot Pain    HPI Gabriela Gonzalez is a 36 y.o. female.  HPI 36 year old female who presents with right foot pain. The patient was at work yesterday when she accidentally dropped an approximately 10-15 pound boxes onto her right toe. She reports immediate onset of aching, throbbing, severe right hip pain. The pain is worse with any movement or palpation. She has been able to walk but has pain with walking since then. No nausea or vomiting. No other injuries. No falls. No numbness or weakness.  Past Medical History:  Diagnosis Date  . Migraine   . Neck pain     Patient Active Problem List   Diagnosis Date Noted  . 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 Neck pain 05/13/2015  . Migraine 05/13/2015    Past Surgical History:  Procedure Laterality Date  . ANTERIOR FUSION CERVICAL SPINE    . TUBAL LIGATION      OB History    Gravida Para Term Preterm AB Living   8       3 5    SAB TAB Ectopic Multiple Live Births   2 1     5        Home Medications    Prior to Admission medications   Medication Sig Start Date End Date Taking? Authorizing Provider  HYDROcodone-acetaminophen (NORCO/VICODIN) 5-325 MG tablet Take 1-2 tablets by mouth every 4 (four) hours as needed. 08/18/16   Shaune Pollackameron Ndidi Nesby, MD  ibuprofen (ADVIL,MOTRIN) 600 MG tablet Take 1 tablet (600 mg total) by mouth every 6 (six) hours as needed. 06/08/16   Bethel BornKelly Marie Gekas, PA-C  naproxen (NAPROSYN) 375 MG tablet Take 1 tablet (375 mg total) by mouth 2 (two) times daily as needed for moderate pain. 08/18/16 08/25/16  Shaune Pollackameron Dametrius Sanjuan, MD  nortriptyline (PAMELOR) 50 MG capsule Take 50 mg by mouth at bedtime.    Historical Provider, MD  polyethylene glycol (MIRALAX / GLYCOLAX) packet Take 17 g by mouth daily.    Historical Provider, MD  tiZANidine (ZANAFLEX) 4 MG  tablet Take 1 tablet (4 mg total) by mouth every 6 (six) hours as needed. 07/23/16   Nilda RiggsNancy Carolyn Difonzo, NP    Family History Family History  Problem Relation Age of Onset  . Asthma Mother     Posey ReaUnsure of father's histoy.    Social History Social History  Substance Use Topics  . Smoking status: Never Smoker  . Smokeless tobacco: Never Used  . Alcohol use No     Allergies   Patient has no known allergies.   Review of Systems Review of Systems  Constitutional: Negative for chills and fever.  Respiratory: Negative for shortness of breath.   Cardiovascular: Negative for chest pain.  Musculoskeletal: Negative for neck pain.  Skin: Negative for rash and wound.  Allergic/Immunologic: Negative for immunocompromised state.  Neurological: Negative for weakness and numbness.  Hematological: Does not bruise/bleed easily.     Physical Exam Updated Vital Signs BP 120/74 (BP Location: Left Arm)   Pulse 66   Temp 98.6 F (37 C) (Oral)   Resp 16   Ht 5' (1.524 m)   Wt 178 lb (80.7 kg)   SpO2 100%   BMI 34.76 kg/m   Physical Exam  Constitutional: She is oriented to person, place, and time. She appears well-developed and well-nourished. No  distress.  HENT:  Head: Normocephalic and atraumatic.  Eyes: Conjunctivae are normal.  Neck: Neck supple.  Cardiovascular: Normal rate, regular rhythm and normal heart sounds.   Pulmonary/Chest: Effort normal. No respiratory distress. She has no wheezes.  Abdominal: She exhibits no distension.  Musculoskeletal: She exhibits no edema.  Neurological: She is alert and oriented to person, place, and time. She exhibits normal muscle tone.  Skin: Skin is warm. Capillary refill takes less than 2 seconds. No rash noted.  Nursing note and vitals reviewed.   Spine Exam: Inspection/Palpation: Moderate TTP over proximal phalanx of great toe. No deformity. No open wounds. Strength: 5/5 throughout LE bilaterally (hip flexion/extension,  adduction/abduction; knee flexion/extension; foot dorsiflexion/plantarflexion, inversion/eversion; great toe inversion) Sensation: Intact to light touch in proximal and distal LE bilaterally Reflexes: 2+ quadriceps and achilles reflexes  ED Treatments / Results  Labs (all labs ordered are listed, but only abnormal results are displayed) Labs Reviewed - No data to display  EKG  EKG Interpretation None       Radiology Dg Foot Complete Right  Result Date: 08/18/2016 CLINICAL DATA:  Injury. EXAM: RIGHT FOOT COMPLETE - 3+ VIEW COMPARISON:  No recent. FINDINGS: No acute bony or joint abnormality identified. No evidence of fracture or dislocation. IMPRESSION: No acute abnormality. Electronically Signed   By: Maisie Fus  Register   On: 08/18/2016 09:46    Procedures Procedures (including critical care time)  Medications Ordered in ED Medications - No data to display   Initial Impression / Assessment and Plan / ED Course  I have reviewed the triage vital signs and the nursing notes.  Pertinent labs & imaging results that were available during my care of the patient were reviewed by me and considered in my medical decision making (see chart for details).     36 year old who presents with right toe and foot pain after dropping an object dominant. Plain films negative. No open wounds. Distal neurovascular culture is intact. Suspect mild contusion and will place in postop shoe and advise gradual return to regular function. Return precautions given.  Final Clinical Impressions(s) / ED Diagnoses   Final diagnoses:  Contusion of right foot, initial encounter    New Prescriptions Discharge Medication List as of 08/18/2016 12:27 PM    START taking these medications   Details  HYDROcodone-acetaminophen (NORCO/VICODIN) 5-325 MG tablet Take 1-2 tablets by mouth every 4 (four) hours as needed., Starting Tue 08/18/2016, Print    naproxen (NAPROSYN) 375 MG tablet Take 1 tablet (375 mg total) by  mouth 2 (two) times daily as needed for moderate pain., Starting Tue 08/18/2016, Until Tue 08/25/2016, Print         Shaune Pollack, MD 08/18/16 [phone]

## 2016-09-03 ENCOUNTER — Encounter (HOSPITAL_BASED_OUTPATIENT_CLINIC_OR_DEPARTMENT_OTHER): Payer: Self-pay | Admitting: Emergency Medicine

## 2016-09-03 ENCOUNTER — Emergency Department (HOSPITAL_BASED_OUTPATIENT_CLINIC_OR_DEPARTMENT_OTHER)
Admission: EM | Admit: 2016-09-03 | Discharge: 2016-09-03 | Disposition: A | Discharge status: Home / Self Care | Payer: Worker's Compensation | Attending: Emergency Medicine | Admitting: Emergency Medicine

## 2016-09-03 DIAGNOSIS — S90111D Contusion of right great toe without damage to nail, subsequent encounter: Secondary | ICD-10-CM | POA: Diagnosis not present

## 2016-09-03 DIAGNOSIS — W208XXD Other cause of strike by thrown, projected or falling object, subsequent encounter: Secondary | ICD-10-CM | POA: Insufficient documentation

## 2016-09-03 DIAGNOSIS — S90121D Contusion of right lesser toe(s) without damage to nail, subsequent encounter: Secondary | ICD-10-CM

## 2016-09-03 DIAGNOSIS — S9031XD Contusion of right foot, subsequent encounter: Secondary | ICD-10-CM

## 2016-09-03 NOTE — ED Notes (Signed)
Pt verbalizes understanding of d/c instructions and denies any further needs at this time. 

## 2016-09-03 NOTE — Discharge Instructions (Signed)
Please contact your human resources department at your employer to determine their workman's compensation provider. You will need to be seen and evaluated by that provider for release to return to work. They will also need to approve any specialists visits.  Wear a supportive shoe. Keep the foot elevated as much as possible.

## 2016-09-03 NOTE — ED Triage Notes (Signed)
Patient states that she had a box fall on her foot. She was supposed to go back to work, but was unable to  - and is unable to get into the orthopedic dr. The patient is unable to follow up

## 2016-09-03 NOTE — ED Provider Notes (Signed)
[facility] Provider Note   CSN: [id number] Arrival date & time: 09/03/16  1854  By signing my name below, I, Linna Darner, attest that this documentation has been prepared under the direction and in the presence of Felicie Morn, NP. Electronically Signed: Linna Darner, Scribe. 09/03/2016. 7:33 PM.  History   Chief Complaint Chief Complaint  Patient presents with  . Follow-up    The history is provided by the patient. No language interpreter was used.  Foot Pain  This is a new problem. The problem occurs constantly. The problem has not changed since onset.Pertinent negatives include no chest pain, no abdominal pain, no headaches and no shortness of breath. The symptoms are aggravated by walking and standing. Nothing relieves the symptoms.    HPI Comments: Gabriela Gonzalez is a 36 y.o. female who presents to the Emergency Department complaining of sudden onset, constant, right foot pain beginning on 08/17/16. She states she dropped a heavy object on her right foot at work and her pain has been constant since. She notes some associated bruising and swelling to her right foot as well. Pt endorses pain radiation into her right lower leg. She reports pain exacerbation with bearing weight on her right foot and with applied pressure to her right foot, and notes she is ambulatory with difficulty secondary to pain. No alleviating factors noted. Patient states she has an upcoming appointment with an orthopedist for the same. She denies numbness/tingling or any other associated symptoms.  Past Medical History:  Diagnosis Date  . Migraine   . Neck pain     Patient Active Problem List   Diagnosis Date Noted  . 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 Neck pain 05/13/2015  . Migraine 05/13/2015    Past Surgical History:  Procedure Laterality Date  . ANTERIOR FUSION CERVICAL SPINE    . TUBAL LIGATION      OB History    Gravida Para Term Preterm AB Living   8       3 5    SAB TAB  Ectopic Multiple Live Births   2 1     5        Home Medications    Prior to Admission medications   Medication Sig Start Date End Date Taking? Authorizing Provider  HYDROcodone-acetaminophen (NORCO/VICODIN) 5-325 MG tablet Take 1-2 tablets by mouth every 4 (four) hours as needed. 08/18/16   Shaune Pollack, MD  ibuprofen (ADVIL,MOTRIN) 600 MG tablet Take 1 tablet (600 mg total) by mouth every 6 (six) hours as needed. 06/08/16   Bethel Born, PA-C  nortriptyline (PAMELOR) 50 MG capsule Take 50 mg by mouth at bedtime.    Historical Provider, MD  polyethylene glycol (MIRALAX / GLYCOLAX) packet Take 17 g by mouth daily.    Historical Provider, MD  tiZANidine (ZANAFLEX) 4 MG tablet Take 1 tablet (4 mg total) by mouth every 6 (six) hours as needed. 07/23/16   Nilda Riggs, NP    Family History Family History  Problem Relation Age of Onset  . Asthma Mother     Posey Rea of father's histoy.    Social History Social History  Substance Use Topics  . Smoking status: Never Smoker  . Smokeless tobacco: Never Used  . Alcohol use No     Allergies   Patient has no known allergies.   Review of Systems Review of Systems  Respiratory: Negative for shortness of breath.   Cardiovascular: Negative for chest pain.  Gastrointestinal: Negative for abdominal pain.  Musculoskeletal: Positive  for arthralgias, joint swelling and myalgias.  Skin: Positive for color change.  Neurological: Negative for headaches.  All other systems reviewed and are negative.    Physical Exam Updated Vital Signs BP 111/83 (BP Location: Right Arm)   Pulse 73   Temp 98 F (36.7 C) (Oral)   Resp 18   Wt 178 lb (80.7 kg)   LMP 08/26/2016   SpO2 100%   BMI 34.76 kg/m   Physical Exam  Constitutional: She is oriented to person, place, and time. She appears well-developed and well-nourished. No distress.  HENT:  Head: Normocephalic and atraumatic.  Eyes: Conjunctivae and EOM are normal.  Neck: Neck  supple. No tracheal deviation present.  Cardiovascular: Normal rate.   Pulmonary/Chest: Effort normal. No respiratory distress.  Musculoskeletal: Normal range of motion.  Neurological: She is alert and oriented to person, place, and time.  Skin: Skin is warm and dry. Ecchymosis noted.  Ecchymosis to the anterior surface of the right great toe.  Psychiatric: She has a normal mood and affect. Her behavior is normal.  Nursing note and vitals reviewed.    ED Treatments / Results  Labs (all labs ordered are listed, but only abnormal results are displayed) Labs Reviewed - No data to display  EKG  EKG Interpretation None       Radiology No results found.  Procedures Procedures (including critical care time)  DIAGNOSTIC STUDIES: Oxygen Saturation is 100% on RA, normal by my interpretation.    COORDINATION OF CARE: 7:43 PM Discussed treatment plan with pt at bedside and pt agreed to plan.  Medications Ordered in ED Medications - No data to display   Initial Impression / Assessment and Plan / ED Course  I have reviewed the triage vital signs and the nursing notes.  Pertinent labs & imaging results that were available during my care of the patient were reviewed by me and considered in my medical decision making (see chart for details).  Patient seen about 2 weeks ago for a foot injury that occurred at work.  She has not followed-up with the company's workman's compensation provider. Patient continues to have bruising to right great toe. No signs of infection. Distal circulation intact.  Patient advised that she needs to follow with workman's compensation for release back to work and for referrals to specialist care.  Conservative therapy recommended and discussed. Patient will be discharged home & is agreeable with above plan. Returns precautions discussed. Pt appears safe for discharge.   Final Clinical Impressions(s) / ED Diagnoses   Final diagnoses:  Contusion of foot  including toes, right, subsequent encounter    New Prescriptions Discharge Medication List as of 09/03/2016  7:49 PM     I personally performed the services described in this documentation, which was scribed in my presence. The recorded information has been reviewed and is accurate.    Felicie Mornavid Sullivan Jacuinde, NP 09/03/16 2345    Lyndal Pulleyaniel Knott, MD 09/04/16 [phone]

## 2016-09-07 ENCOUNTER — Ambulatory Visit (INDEPENDENT_AMBULATORY_CARE_PROVIDER_SITE_OTHER): Payer: Worker's Compensation | Admitting: Podiatry

## 2016-09-07 ENCOUNTER — Encounter: Payer: Self-pay | Admitting: Podiatry

## 2016-09-07 ENCOUNTER — Ambulatory Visit (INDEPENDENT_AMBULATORY_CARE_PROVIDER_SITE_OTHER): Payer: Worker's Compensation

## 2016-09-07 VITALS — BP 106/70 | HR 67 | Resp 16

## 2016-09-07 DIAGNOSIS — S99921A Unspecified injury of right foot, initial encounter: Secondary | ICD-10-CM | POA: Diagnosis not present

## 2016-09-07 DIAGNOSIS — M7751 Other enthesopathy of right foot: Secondary | ICD-10-CM

## 2016-09-07 DIAGNOSIS — M779 Enthesopathy, unspecified: Secondary | ICD-10-CM

## 2016-09-07 DIAGNOSIS — M778 Other enthesopathies, not elsewhere classified: Secondary | ICD-10-CM

## 2016-09-07 MED ORDER — TRIAMCINOLONE ACETONIDE 10 MG/ML IJ SUSP: 10.0000 mg | Freq: Once | INTRAMUSCULAR | Status: DC

## 2016-09-07 NOTE — Progress Notes (Signed)
Subjective:     Patient ID: Gabriela Gonzalez, female   DOB: 1981-03-11, 36 y.o.   MRN: [id number]  HPI patient points to her right big toe stating that February 20 she dropped a box on it at work and she traumatized her big toe and is been in a shoe but it's not helping and she's having significant pain in the joint   Review of Systems  All other systems reviewed and are negative.      Objective:   Physical Exam  Constitutional: She is oriented to person, place, and time.  Cardiovascular: Intact distal pulses.   Musculoskeletal: Normal range of motion.  Neurological: She is oriented to person, place, and time.  Skin: Skin is warm.  Nursing note and vitals reviewed.  neurovascular status intact muscle strength adequate with discomfort of the big toe joint right with inflammation fluid mostly on the lateral side of the joint and across the dorsal surface. There is edema surrounding this area and it's tender when palpated     Assessment:     traumatized right first MPJ secondary to injury sustained at work on 2 -20-18 Plan:     X-rays taken reviewed and injected the right first MPJ 3 mg Kenalog 5 mill grams Xylocaine advised on physical therapy and dispensed air fracture walker to immobilize

## 2016-09-21 ENCOUNTER — Encounter: Payer: Self-pay | Admitting: Podiatry

## 2016-09-21 ENCOUNTER — Ambulatory Visit (INDEPENDENT_AMBULATORY_CARE_PROVIDER_SITE_OTHER): Payer: Worker's Compensation | Admitting: Podiatry

## 2016-09-21 DIAGNOSIS — M7751 Other enthesopathy of right foot: Secondary | ICD-10-CM | POA: Diagnosis not present

## 2016-09-21 DIAGNOSIS — S99921A Unspecified injury of right foot, initial encounter: Secondary | ICD-10-CM

## 2016-09-21 DIAGNOSIS — M779 Enthesopathy, unspecified: Secondary | ICD-10-CM

## 2016-09-21 DIAGNOSIS — M778 Other enthesopathies, not elsewhere classified: Secondary | ICD-10-CM

## 2016-09-22 ENCOUNTER — Telehealth: Payer: Self-pay | Admitting: *Deleted

## 2016-09-22 NOTE — Progress Notes (Signed)
Subjective:     Patient ID: Gabriela Gonzalez, female   DOB: 07-May-1981, 36 y.o.   MRN: [id number]  Patient states that she's been feeling quite a bit better with the medication we utilized and boot but she still has some soreness and the [employer]. Dr. evaluated her and wanted her out of her boot completely     Review of Systems     Objective:   Physical Exam Neurovascular status was found to be intact muscle strength was adequate range of motion within normal limits with patient's first MPJ right being mildly swollen and mildly tender when pressed but significantly improved from previous visit    Assessment:     Improving first MPJ secondary to trauma with mild discomfort still noted    Plan:     Reviewed condition and recommended gradual reduction and boot over the next several weeks with ice therapy and wearing rigid bottom shoes. I did scanned for orthotic to provide for support underneath the first metatarsal and I think this will be beneficial long-term and I do think that this will completely resolve over the next short period of time. Patient will continue conservative treatment and be seen back for reevaluation when orthotics are ready in 3 months and we'll continue to reduce the boot over that time

## 2016-09-22 NOTE — Telephone Encounter (Signed)
Dr. Dareen PianoAnderson called to speak with Dr. Charlsie Merlesegal concerning pt's plan of care.

## 2016-10-12 ENCOUNTER — Other Ambulatory Visit: Payer: Medicaid Other

## 2016-10-15 ENCOUNTER — Ambulatory Visit (INDEPENDENT_AMBULATORY_CARE_PROVIDER_SITE_OTHER): Payer: Worker's Compensation | Admitting: Podiatry

## 2016-10-15 ENCOUNTER — Encounter: Payer: Self-pay | Admitting: Podiatry

## 2016-10-15 DIAGNOSIS — M7751 Other enthesopathy of right foot: Secondary | ICD-10-CM

## 2016-10-15 DIAGNOSIS — M778 Other enthesopathies, not elsewhere classified: Secondary | ICD-10-CM

## 2016-10-15 DIAGNOSIS — S99921A Unspecified injury of right foot, initial encounter: Secondary | ICD-10-CM

## 2016-10-15 DIAGNOSIS — M779 Enthesopathy, unspecified: Secondary | ICD-10-CM

## 2016-10-15 NOTE — Progress Notes (Signed)
Subjective:     Patient ID: Gabriela Gonzalez, female   DOB: [birth year], 36 y.o.   MRN: [id number]  HPI patient states I'm feeling quite a bit better but I'm getting some tingling in my big toe   Review of Systems     Objective:   Physical Exam Neurovascular status intact with right hallux much more stable with orthotics but has some slight tingle that is present that I was unable to elicit today    Assessment:    possibility that there may be a small nerve contusion secondary to the previous injury   that should be uneventful and he Plan:     Reviewed condition and at this point patient is working and I've encouraged her to continue and this should be uneventful for complete healing

## 2016-12-16 ENCOUNTER — Emergency Department (HOSPITAL_BASED_OUTPATIENT_CLINIC_OR_DEPARTMENT_OTHER)
Admission: EM | Admit: 2016-12-16 | Discharge: 2016-12-16 | Disposition: A | Discharge status: Home / Self Care | Payer: BLUE CROSS/BLUE SHIELD | Attending: Emergency Medicine | Admitting: Emergency Medicine

## 2016-12-16 ENCOUNTER — Encounter (HOSPITAL_BASED_OUTPATIENT_CLINIC_OR_DEPARTMENT_OTHER): Payer: Self-pay | Admitting: *Deleted

## 2016-12-16 DIAGNOSIS — L299 Pruritus, unspecified: Secondary | ICD-10-CM | POA: Diagnosis not present

## 2016-12-16 DIAGNOSIS — Y939 Activity, unspecified: Secondary | ICD-10-CM | POA: Diagnosis not present

## 2016-12-16 DIAGNOSIS — Z23 Encounter for immunization: Secondary | ICD-10-CM | POA: Insufficient documentation

## 2016-12-16 DIAGNOSIS — R51 Headache: Secondary | ICD-10-CM | POA: Diagnosis not present

## 2016-12-16 DIAGNOSIS — Y929 Unspecified place or not applicable: Secondary | ICD-10-CM | POA: Diagnosis not present

## 2016-12-16 DIAGNOSIS — S80862A Insect bite (nonvenomous), left lower leg, initial encounter: Secondary | ICD-10-CM | POA: Diagnosis not present

## 2016-12-16 DIAGNOSIS — Y999 Unspecified external cause status: Secondary | ICD-10-CM | POA: Diagnosis not present

## 2016-12-16 DIAGNOSIS — Z79899 Other long term (current) drug therapy: Secondary | ICD-10-CM | POA: Insufficient documentation

## 2016-12-16 DIAGNOSIS — R519 Headache, unspecified: Secondary | ICD-10-CM

## 2016-12-16 DIAGNOSIS — S80861A Insect bite (nonvenomous), right lower leg, initial encounter: Secondary | ICD-10-CM | POA: Insufficient documentation

## 2016-12-16 DIAGNOSIS — W57XXXA Bitten or stung by nonvenomous insect and other nonvenomous arthropods, initial encounter: Secondary | ICD-10-CM | POA: Diagnosis not present

## 2016-12-16 DIAGNOSIS — Z5189 Encounter for other specified aftercare: Secondary | ICD-10-CM

## 2016-12-16 MED ORDER — TETANUS-DIPHTH-ACELL PERTUSSIS 5-2.5-18.5 LF-MCG/0.5 IM SUSP
0.5000 mL | Freq: Once | INTRAMUSCULAR | Status: AC
  Administered 2016-12-16: 0.5 mL via INTRAMUSCULAR
  Filled 2016-12-16: qty 0.5

## 2016-12-16 MED ORDER — METOCLOPRAMIDE HCL 5 MG/ML IJ SOLN
10.0000 mg | Freq: Once | INTRAMUSCULAR | Status: AC
  Administered 2016-12-16: 10 mg via INTRAVENOUS
  Filled 2016-12-16: qty 2

## 2016-12-16 MED ORDER — KETOROLAC TROMETHAMINE 30 MG/ML IJ SOLN
30.0000 mg | Freq: Once | INTRAMUSCULAR | Status: AC
  Administered 2016-12-16: 30 mg via INTRAVENOUS
  Filled 2016-12-16: qty 1

## 2016-12-16 MED ORDER — DEXAMETHASONE SODIUM PHOSPHATE 10 MG/ML IJ SOLN
10.0000 mg | Freq: Once | INTRAMUSCULAR | Status: AC
  Administered 2016-12-16: 10 mg via INTRAVENOUS
  Filled 2016-12-16: qty 1

## 2016-12-16 MED ORDER — DIPHENHYDRAMINE HCL 50 MG/ML IJ SOLN
25.0000 mg | Freq: Once | INTRAMUSCULAR | Status: AC
  Administered 2016-12-16: 25 mg via INTRAVENOUS
  Filled 2016-12-16: qty 1

## 2016-12-16 MED ORDER — SODIUM CHLORIDE 0.9 % IV BOLUS (SEPSIS)
500.0000 mL | Freq: Once | INTRAVENOUS | Status: AC
  Administered 2016-12-16: 500 mL via INTRAVENOUS

## 2016-12-16 NOTE — ED Triage Notes (Signed)
Pt reports legs bitten by mosquitoes on Sunday. Legs are still very itchy despite using topical treatments. Also reports mha since last light. + nausea. She has taken tylenol without relief

## 2016-12-16 NOTE — ED Provider Notes (Signed)
[facility] Note   CSN: [id number] Arrival date & time: 12/16/16  1048     History   Chief Complaint Chief Complaint  Patient presents with  . Insect Bite  . Headache    HPI   Blood pressure 128/86, pulse 65, temperature 98.5 F (36.9 C), temperature source Oral, resp. rate 16, height 4\' 11"  (1.499 m), weight 73.5 kg (162 lb), last menstrual period 12/13/2016, SpO2 100 %.  Gabriela Gonzalez is a 36 y.o. female complaining of Multiple insect bites to bilateral legs which have been bothering her over the course of last several days she's been applying topical Benadryl with little relief. She also reports a frontal headache which she describes as a migraine onset yesterday morning she states that this typical for her prior migraines however she has not been affected in the last year or so. Pt denies fever, confusion, cervicalgia, LOC/syncope, change in vision, N/V, numbness, weakness, dysarthria, ataxia, thunderclap onset, exacerbation with exertion or valsalva, exacerbation in morning, CP, SOB, abdominal pain. Last tetanus shot is unknown.    Past Medical History:  Diagnosis Date  . Migraine   . Neck pain     Patient Active Problem List   Diagnosis Date Noted  . 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 Neck pain 05/13/2015  . Migraine 05/13/2015    Past Surgical History:  Procedure Laterality Date  . ANTERIOR FUSION CERVICAL SPINE    . TUBAL LIGATION      OB History    Gravida Para Term Preterm AB Living   8       3 5    SAB TAB Ectopic Multiple Live Births   2 1     5        Home Medications    Prior to Admission medications   Medication Sig Start Date End Date Taking? Authorizing Provider  ibuprofen (ADVIL,MOTRIN) 600 MG tablet Take 1 tablet (600 mg total) by mouth every 6 (six) hours as needed. 06/08/16   Bethel BornGekas, Kelly Marie, PA-C  nortriptyline (PAMELOR) 50 MG capsule Take 50 mg by mouth at bedtime.    [provider]  polyethylene  glycol (MIRALAX / GLYCOLAX) packet Take 17 g by mouth daily.    [provider]  tiZANidine (ZANAFLEX) 4 MG tablet Take 1 tablet (4 mg total) by mouth every 6 (six) hours as needed. 07/23/16   Nilda RiggsMartin, Nancy Carolyn, NP    Family History Family History  Problem Relation Age of Onset  . Asthma Mother        Posey ReaUnsure of father's histoy.    Social History Social History  Substance Use Topics  . Smoking status: Never Smoker  . Smokeless tobacco: Never Used  . Alcohol use No     Allergies   Patient has no known allergies.   Review of Systems Review of Systems  A complete review of systems was obtained and all systems are negative except as noted in the HPI and PMH.    Physical Exam Updated Vital Signs BP 111/76 (BP Location: Right Arm)   Pulse 64   Temp 98.5 F (36.9 C) (Oral)   Resp 16   Ht 4\' 11"  (1.499 m)   Wt 73.5 kg (162 lb)   LMP 12/13/2016   SpO2 98%   BMI 32.72 kg/m   Physical Exam  Constitutional: She is oriented to person, place, and time. She appears well-developed and well-nourished.  HENT:  Head: Normocephalic and atraumatic.  Mouth/Throat: Oropharynx is clear and moist.  Eyes: Conjunctivae and EOM are normal. Pupils are equal, round, and reactive to light.  No TTP of maxillary or frontal sinuses  No TTP or induration of temporal arteries bilaterally  Neck: Normal range of motion. Neck supple.  FROM to C-spine. Pt can touch chin to chest without discomfort. No TTP of midline cervical spine.   Cardiovascular: Normal rate, regular rhythm and intact distal pulses.   Pulmonary/Chest: Effort normal and breath sounds normal. No respiratory distress. She has no wheezes. She has no rales. She exhibits no tenderness.  Abdominal: Soft. Bowel sounds are normal. There is no tenderness.  Musculoskeletal: Normal range of motion. She exhibits no edema or tenderness.  Neurological: She is alert and oriented to person, place, and time. No cranial nerve deficit.   II-Visual fields grossly intact. III/IV/VI-Extraocular movements intact.  Pupils reactive bilaterally. V/VII-Smile symmetric, equal eyebrow raise,  facial sensation intact VIII- Hearing grossly intact IX/X-Normal gag XI-bilateral shoulder shrug XII-midline tongue extension Motor: 5/5 bilaterally with normal tone and bulk Cerebellar: Normal finger-to-nose  and normal heel-to-shin test.   Romberg negative Ambulates with a coordinated gait   Skin:  Multiple excoriated wounds to bilateral lower extremities with no warmth, tenderness palpation or discharge.  Nursing note and vitals reviewed.    ED Treatments / Results  Labs (all labs ordered are listed, but only abnormal results are displayed) Labs Reviewed - No data to display  EKG  EKG Interpretation None       Radiology No results found.  Procedures Procedures (including critical care time)  Medications Ordered in ED Medications  Tdap (BOOSTRIX) injection 0.5 mL (0.5 mLs Intramuscular Given 12/16/16 1144)  ketorolac (TORADOL) 30 MG/ML injection 30 mg (30 mg Intravenous Given 12/16/16 1146)  dexamethasone (DECADRON) injection 10 mg (10 mg Intravenous Given 12/16/16 1142)  sodium chloride 0.9 % bolus 500 mL (0 mLs Intravenous Stopped 12/16/16 1400)  metoCLOPramide (REGLAN) injection 10 mg (10 mg Intravenous Given 12/16/16 1314)  diphenhydrAMINE (BENADRYL) injection 25 mg (25 mg Intravenous Given 12/16/16 1312)     Initial Impression / Assessment and Plan / ED Course  I have reviewed the triage vital signs and the nursing notes.  Pertinent labs & imaging results that were available during my care of the patient were reviewed by me and considered in my medical decision making (see chart for details).     Vitals:   12/16/16 1055 12/16/16 1317 12/16/16 1357  BP: 128/86 125/63 111/76  Pulse: 65 (!) 56 64  Resp: 16 14 16   Temp: 98.5 F (36.9 C)    TempSrc: Oral    SpO2: 100% 99% 98%  Weight: 73.5 kg (162 lb)      Height: 4\' 11"  (1.499 m)      Medications  Tdap (BOOSTRIX) injection 0.5 mL (0.5 mLs Intramuscular Given 12/16/16 1144)  ketorolac (TORADOL) 30 MG/ML injection 30 mg (30 mg Intravenous Given 12/16/16 1146)  dexamethasone (DECADRON) injection 10 mg (10 mg Intravenous Given 12/16/16 1142)  sodium chloride 0.9 % bolus 500 mL (0 mLs Intravenous Stopped 12/16/16 1400)  metoCLOPramide (REGLAN) injection 10 mg (10 mg Intravenous Given 12/16/16 1314)  diphenhydrAMINE (BENADRYL) injection 25 mg (25 mg Intravenous Given 12/16/16 1312)    Gabriela Gonzalez is 36 y.o. female presenting with Headache this seems typical for prior, no red flags, normal neurologic exam. No meningeal signs. Patient is overall well appearing. Patient is improved after headache cocktail and fluids. Her tetanus shot is updated, the Decadron from headache cocktail should help alleviate the pruritus  from the insect bites. There is no signs of infection of the, insect bites. Advised her to follow closely with primary care.  Evaluation does not show pathology that would require ongoing emergent intervention or inpatient treatment. Pt is hemodynamically stable and mentating appropriately. Discussed findings and plan with patient/guardian, who agrees with care plan. All questions answered. Return precautions discussed and outpatient follow up given.    Final Clinical Impressions(s) / ED Diagnoses   Final diagnoses:  Visit for wound check  Nonintractable headache, unspecified chronicity pattern, unspecified headache type    New Prescriptions Discharge Medication List as of 12/16/2016  1:55 PM       Willine Schwalbe, Mardella Layman 12/16/16 1510    Geoffery Lyons, MD 12/17/16 [phone]

## 2016-12-16 NOTE — Discharge Instructions (Signed)
Please follow with your primary care doctor in the next 2 days for a check-up. They must obtain records for further management.  ° °Do not hesitate to return to the Emergency Department for any new, worsening or concerning symptoms.  ° °

## 2016-12-23 ENCOUNTER — Other Ambulatory Visit: Payer: Self-pay | Admitting: Neurology

## 2017-01-20 ENCOUNTER — Ambulatory Visit: Payer: Medicaid Other | Admitting: Nurse Practitioner

## 2017-03-02 ENCOUNTER — Ambulatory Visit: Payer: Medicaid Other | Admitting: Nurse Practitioner
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
## 2017-04-13 ENCOUNTER — Encounter: Payer: Self-pay | Admitting: Nurse Practitioner

## 2017-04-13 ENCOUNTER — Ambulatory Visit (INDEPENDENT_AMBULATORY_CARE_PROVIDER_SITE_OTHER): Payer: Medicaid Other | Admitting: Nurse Practitioner

## 2017-04-13 VITALS — BP 113/70 | HR 64 | Ht 59.0 in | Wt 171.4 lb

## 2017-04-13 DIAGNOSIS — M542 Cervicalgia: Secondary | ICD-10-CM

## 2017-04-13 DIAGNOSIS — G43009 Migraine without aura, not intractable, without status migrainosus: Secondary | ICD-10-CM | POA: Diagnosis not present

## 2017-04-13 MED ORDER — TIZANIDINE HCL 4 MG PO TABS: 4.0000 mg | ORAL_TABLET | Freq: Four times a day (QID) | ORAL | 6 refills | Status: DC | PRN

## 2017-04-13 MED ORDER — RIZATRIPTAN BENZOATE 10 MG PO TBDP: ORAL_TABLET | ORAL | 6 refills | Status: DC

## 2017-04-13 NOTE — Patient Instructions (Signed)
continue   tizanidine to 4 mg twice daily refilled Continue paying  attention to her migraine triggers May have to consider Botox in the future patient would like to hold off on that Continue Nortriptyline  at HS Continue Maxalt acutely Follow-up in 6 months next with Dr. Terrace Arabia

## 2017-04-14 NOTE — Progress Notes (Signed)
I have reviewed and agreed above plan. 

## 2017-07-15 ENCOUNTER — Encounter (HOSPITAL_BASED_OUTPATIENT_CLINIC_OR_DEPARTMENT_OTHER): Payer: Self-pay | Admitting: Emergency Medicine

## 2017-07-15 ENCOUNTER — Emergency Department (HOSPITAL_BASED_OUTPATIENT_CLINIC_OR_DEPARTMENT_OTHER)
Admission: EM | Admit: 2017-07-15 | Discharge: 2017-07-15 | Disposition: A | Discharge status: Home / Self Care | Payer: Medicaid Other | Attending: Emergency Medicine | Admitting: Emergency Medicine

## 2017-07-15 ENCOUNTER — Other Ambulatory Visit: Payer: Self-pay

## 2017-07-15 DIAGNOSIS — Y999 Unspecified external cause status: Secondary | ICD-10-CM | POA: Insufficient documentation

## 2017-07-15 DIAGNOSIS — Y939 Activity, unspecified: Secondary | ICD-10-CM | POA: Diagnosis not present

## 2017-07-15 DIAGNOSIS — Y929 Unspecified place or not applicable: Secondary | ICD-10-CM | POA: Diagnosis not present

## 2017-07-15 DIAGNOSIS — S299XXA Unspecified injury of thorax, initial encounter: Secondary | ICD-10-CM | POA: Diagnosis present

## 2017-07-15 DIAGNOSIS — S29019A Strain of muscle and tendon of unspecified wall of thorax, initial encounter: Secondary | ICD-10-CM

## 2017-07-15 DIAGNOSIS — S29012A Strain of muscle and tendon of back wall of thorax, initial encounter: Secondary | ICD-10-CM | POA: Diagnosis not present

## 2017-07-15 DIAGNOSIS — X58XXXA Exposure to other specified factors, initial encounter: Secondary | ICD-10-CM | POA: Diagnosis not present

## 2017-07-15 LAB — URINALYSIS, ROUTINE W REFLEX MICROSCOPIC
Bilirubin Urine: NEGATIVE
GLUCOSE, UA: NEGATIVE mg/dL
Hgb urine dipstick: NEGATIVE
Ketones, ur: NEGATIVE mg/dL
LEUKOCYTES UA: NEGATIVE
Nitrite: NEGATIVE
PH: 6.5 (ref 5.0–8.0)
Protein, ur: NEGATIVE mg/dL
SPECIFIC GRAVITY, URINE: 1.025 (ref 1.005–1.030)

## 2017-07-15 LAB — PREGNANCY, URINE: Preg Test, Ur: NEGATIVE

## 2017-07-15 MED ORDER — IBUPROFEN 800 MG PO TABS: 800.0000 mg | ORAL_TABLET | Freq: Three times a day (TID) | ORAL | 0 refills | Status: AC | PRN

## 2017-07-15 MED ORDER — METHOCARBAMOL 500 MG PO TABS: 500.0000 mg | ORAL_TABLET | Freq: Three times a day (TID) | ORAL | 0 refills | Status: DC | PRN

## 2017-07-15 MED ORDER — IBUPROFEN 800 MG PO TABS
800.0000 mg | ORAL_TABLET | Freq: Once | ORAL | Status: AC
  Administered 2017-07-15: 800 mg via ORAL
  Filled 2017-07-15: qty 1

## 2017-07-15 NOTE — ED Provider Notes (Signed)
TIME SEEN: 3:26 AM  CHIEF COMPLAINT: Left thoracic back pain  HPI: Patient is a 37 year old female with history of migraine headaches who presents to the emergency department with thoracic back pain.  Describes it as feeling like a "toothache".  Has tried ibuprofen, massage, icy hot without much relief.  Symptoms worse with movement, twisting, bending.  No injury that she can recall.  No numbness, tingling or focal weakness.  No bowel or bladder incontinence.  No fever.  No history of kidney stones.  No dysuria or hematuria.  ROS: See HPI Constitutional: no fever  Eyes: no drainage  ENT: no runny nose   Cardiovascular:  no chest pain  Resp: no SOB  GI: no vomiting GU: no dysuria Integumentary: no rash  Allergy: no hives  Musculoskeletal: no leg swelling  Neurological: no slurred speech ROS otherwise negative  PAST MEDICAL HISTORY/PAST SURGICAL HISTORY:  Past Medical History:  Diagnosis Date  . Migraine   . Neck pain     MEDICATIONS:  Prior to Admission medications   Medication Sig Start Date End Date Taking? Authorizing Provider  nortriptyline (PAMELOR) 50 MG capsule Take 50 mg by mouth at bedtime.    [provider]  polyethylene glycol (MIRALAX / GLYCOLAX) packet Take 17 g by mouth daily.    [provider]  rizatriptan (MAXALT-MLT) 10 MG disintegrating tablet DISSOLVE ONE TABLET BY MOUTH AS NEEDED. MAY REPEAT IN 2 HOURS IF NEEDED 04/13/17   Nilda RiggsMartin, Nancy Carolyn, NP  tiZANidine (ZANAFLEX) 4 MG tablet Take 1 tablet (4 mg total) by mouth every 6 (six) hours as needed. 04/13/17   Nilda RiggsMartin, Nancy Carolyn, NP    ALLERGIES:  No Known Allergies  SOCIAL HISTORY:  Social History   Tobacco Use  . Smoking status: Never Smoker  . Smokeless tobacco: Never Used  Substance Use Topics  . Alcohol use: No    FAMILY HISTORY: Family History  Problem Relation Age of Onset  . Asthma Mother        Posey ReaUnsure of father's histoy.    EXAM: BP 130/87   Pulse (!) 57    Temp 98.3 F (36.8 C) (Oral)   Resp 16   Ht 4\' 11"  (1.499 m)   Wt 76.7 kg (169 lb)   SpO2 99%   BMI 34.13 kg/m  CONSTITUTIONAL: Alert and oriented and responds appropriately to questions. Well-appearing; well-nourished, smiling, laughing HEAD: Normocephalic EYES: Conjunctivae clear, pupils appear equal, EOMI ENT: normal nose; moist mucous membranes NECK: Supple, no meningismus, no nuchal rigidity, no LAD  CARD: RRR; S1 and S2 appreciated; no murmurs, no clicks, no rubs, no gallops RESP: Normal chest excursion without splinting or tachypnea; breath sounds clear and equal bilaterally; no wheezes, no rhonchi, no rales, no hypoxia or respiratory distress, speaking full sentences ABD/GI: Normal bowel sounds; non-distended; soft, non-tender, no rebound, no guarding, no peritoneal signs, no hepatosplenomegaly BACK:  The back appears normal and is tender over the left thoracic paraspinal muscles, no midline spinal tenderness or step-off or deformity, there is no CVA tenderness EXT: Normal ROM in all joints; non-tender to palpation; no edema; normal capillary refill; no cyanosis, no calf tenderness or swelling    SKIN: Normal color for age and race; warm; no rash NEURO: Moves all extremities equally, strength 5/5 in all 4 extremities, sensation to light touch intact diffusely, no saddle anesthesia, normal gait PSYCH: The patient's mood and manner are appropriate. Grooming and personal hygiene are appropriate.  MEDICAL DECISION MAKING: Patient here with likely thoracic strain,  muscle spasm.  Urine shows no sign of infection or blood to suggest kidney stone or pyelonephritis.  Pregnancy test negative.  Normal neurologic exam.  No red flag symptoms to suggest fracture, epidural abscess or hematoma, discitis or osteomyelitis, transverse myelitis, spinal stenosis or cauda equina.  I do not feel she needs emergent imaging of her back.  I recommended anti-inflammatories and will discharge with prescription of  muscle relaxers.  Discussed stretching, massage, alternating heat and ice to this area.  Patient comfortable with this plan.   At this time, I do not feel there is any life-threatening condition present. I have reviewed and discussed all results (EKG, imaging, lab, urine as appropriate) and exam findings with patient/family. I have reviewed nursing notes and appropriate previous records.  I feel the patient is safe to be discharged home without further emergent workup and can continue workup as an outpatient as needed. Discussed usual and customary return precautions. Patient/family verbalize understanding and are comfortable with this plan.  Outpatient follow-up has been provided if needed. All questions have been answered.      Jennesis Ramaswamy, Layla Maw, DO 07/15/17 [phone]

## 2017-07-15 NOTE — Discharge Instructions (Signed)
You may alternate Tylenol 1000 mg every 6 hours as needed for pain and Ibuprofen 800 mg every 8 hours as needed for pain.  Please take Ibuprofen with food. ° °

## 2017-07-15 NOTE — ED Triage Notes (Signed)
Pt c/o left sided mid-thoracic back pain. Pt denies any cough.

## 2017-10-20 ENCOUNTER — Ambulatory Visit: Payer: Self-pay | Admitting: Neurology

## 2017-10-27 ENCOUNTER — Telehealth: Payer: Self-pay | Admitting: Neurology

## 2017-10-27 ENCOUNTER — Ambulatory Visit: Payer: Medicaid Other | Admitting: Neurology

## 2017-10-27 ENCOUNTER — Encounter: Payer: Self-pay | Admitting: Neurology

## 2017-10-27 VITALS — BP 119/79 | HR 63 | Ht 65.0 in | Wt 184.5 lb

## 2017-10-27 DIAGNOSIS — G43009 Migraine without aura, not intractable, without status migrainosus: Secondary | ICD-10-CM | POA: Diagnosis not present

## 2017-10-27 DIAGNOSIS — M542 Cervicalgia: Secondary | ICD-10-CM

## 2017-10-27 MED ORDER — RIZATRIPTAN BENZOATE 10 MG PO TBDP: ORAL_TABLET | ORAL | 6 refills | Status: DC

## 2017-10-27 MED ORDER — DULOXETINE HCL 60 MG PO CPEP: 60.0000 mg | ORAL_CAPSULE | Freq: Every day | ORAL | 11 refills | Status: AC

## 2017-10-27 MED ORDER — ONDANSETRON 4 MG PO TBDP: 4.0000 mg | ORAL_TABLET | Freq: Three times a day (TID) | ORAL | 6 refills | Status: AC | PRN

## 2017-10-27 MED ORDER — TIZANIDINE HCL 4 MG PO TABS: 4.0000 mg | ORAL_TABLET | Freq: Four times a day (QID) | ORAL | 6 refills | Status: DC | PRN

## 2017-10-27 MED ORDER — NORTRIPTYLINE HCL 50 MG PO CAPS: 100.0000 mg | ORAL_CAPSULE | Freq: Every day | ORAL | 11 refills | Status: AC
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
## 2017-10-27 NOTE — Telephone Encounter (Signed)
Patient's MRI has been sent to [facility]. DW  °

## 2017-10-27 NOTE — Patient Instructions (Signed)
You should take Cymbalta 60 mg every morning  Increase nortriptyline to 50 mg 2 tablets every night  Rescue medication for headaches You take Maxalt 10 mg as needed, may combine it together with Zofran for nausea, tizanidine for muscle spasm, even ibuprofen 600 mg  But do not use Maxalt more than 2 tablets in 24 hours, Limit the rescue medications in less than 2 times each week

## 2017-11-04 ENCOUNTER — Ambulatory Visit
Admission: RE | Admit: 2017-11-04 | Discharge: 2017-11-04 | Disposition: A | Discharge status: Home / Self Care | Payer: Medicaid Other | Source: Ambulatory Visit | Attending: Neurology | Admitting: Neurology

## 2017-11-04 DIAGNOSIS — G43009 Migraine without aura, not intractable, without status migrainosus: Secondary | ICD-10-CM

## 2017-11-04 DIAGNOSIS — M542 Cervicalgia: Secondary | ICD-10-CM

## 2017-11-05 ENCOUNTER — Telehealth: Payer: Self-pay | Admitting: Neurology

## 2017-11-05 NOTE — Telephone Encounter (Signed)
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

## 2017-11-05 NOTE — Telephone Encounter (Signed)
Spoke with Jovanna and reviewed below MRI report.  She verbalized understanding of same/fim

## 2017-12-14 ENCOUNTER — Other Ambulatory Visit: Payer: Self-pay

## 2017-12-14 DIAGNOSIS — Z3142 Aftercare following sterilization reversal: Secondary | ICD-10-CM

## 2017-12-14 NOTE — Progress Notes (Signed)
Pt trying to conceive after tubal reversal surgery states reproductive provider told her to f/u with OBGYN so she doesn't have to travel back to [place]. Per Dr. Alysia PennaErvin, she needs referral to Dr. Lyndal RainbowYalcinkaya's office to f/u with tubal reversal surgery and reproduction management. Appt 12/24/17 canceled per Dr. Alysia PennaErvin.

## 2017-12-22 ENCOUNTER — Ambulatory Visit: Payer: Medicaid Other | Admitting: Obstetrics and Gynecology

## 2018-02-17 ENCOUNTER — Encounter (HOSPITAL_BASED_OUTPATIENT_CLINIC_OR_DEPARTMENT_OTHER): Payer: Self-pay | Admitting: *Deleted

## 2018-02-17 ENCOUNTER — Other Ambulatory Visit: Payer: Self-pay

## 2018-02-17 ENCOUNTER — Emergency Department (HOSPITAL_BASED_OUTPATIENT_CLINIC_OR_DEPARTMENT_OTHER)
Admission: EM | Admit: 2018-02-17 | Discharge: 2018-02-17 | Disposition: A | Discharge status: Home / Self Care | Payer: Medicaid Other | Attending: Emergency Medicine | Admitting: Emergency Medicine

## 2018-02-17 DIAGNOSIS — R21 Rash and other nonspecific skin eruption: Secondary | ICD-10-CM | POA: Diagnosis present

## 2018-02-17 DIAGNOSIS — L237 Allergic contact dermatitis due to plants, except food: Secondary | ICD-10-CM

## 2018-02-17 DIAGNOSIS — Z79899 Other long term (current) drug therapy: Secondary | ICD-10-CM | POA: Insufficient documentation

## 2018-02-17 MED ORDER — DEXAMETHASONE 6 MG PO TABS
10.0000 mg | ORAL_TABLET | Freq: Once | ORAL | Status: AC
  Administered 2018-02-17: 10 mg via ORAL
  Filled 2018-02-17: qty 1

## 2018-02-17 NOTE — Discharge Instructions (Signed)
Try Caladryl, you can buy this over-the-counter.  You can try oral Benadryl tablets if he like.

## 2018-02-17 NOTE — ED Notes (Signed)
Pt reports using OTC Benadryl and Hydrocortisone Cream w/o relief.

## 2018-02-17 NOTE — ED Triage Notes (Signed)
Pt reports "mosquito bites" to her right forearm x 3 days with redness and itching.

## 2018-02-17 NOTE — ED Provider Notes (Signed)
[facility] EMERGENCY DEPARTMENT Provider Note   CSN: [id number] Arrival date & time: 02/17/18  1506     History   Chief Complaint Chief Complaint  Patient presents with  . Rash    HPI Gabriela Gonzalez is a 37 y.o. female.  37 yo F with a chief complaint of an itchy rash.  Has been on her right arm for the past 3 or 4 days.  She denies outdoor activity or plant exposure.  She denies fevers or chills she denies new lotions or detergents.  She has tried oral Benadryl with minimal relief.  The history is provided by the patient.  Rash   This is a new problem. The current episode started more than 2 days ago. The problem has not changed since onset.The problem is associated with nothing. The pain is mild. The pain has been constant since onset. Associated symptoms include itching. She has tried antihistamines for the symptoms. The treatment provided no relief.    Past Medical History:  Diagnosis Date  . Migraine   . Neck pain     Patient Active Problem List   Diagnosis Date Noted  . 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 Neck pain 05/13/2015  . Migraine 05/13/2015    Past Surgical History:  Procedure Laterality Date  . ANTERIOR FUSION CERVICAL SPINE    . TUBAL LIGATION       OB History    Gravida  8   Para      Term      Preterm      AB  3   Living  5     SAB  2   TAB  1   Ectopic      Multiple      Live Births  5            Home Medications    Prior to Admission medications   Medication Sig Start Date End Date Taking? Authorizing Provider  DULoxetine (CYMBALTA) 60 MG capsule Take 1 capsule (60 mg total) by mouth daily. 10/27/17   Levert Feinstein, MD  ibuprofen (ADVIL,MOTRIN) 800 MG tablet Take 1 tablet (800 mg total) by mouth every 8 (eight) hours as needed for mild pain. 07/15/17   Ward, Layla Maw, DO  nortriptyline (PAMELOR) 50 MG capsule Take 2 capsules (100 mg total) by mouth at bedtime. 10/27/17   Levert Feinstein, MD  ondansetron (ZOFRAN  ODT) 4 MG disintegrating tablet Take 1 tablet (4 mg total) by mouth every 8 (eight) hours as needed. 10/27/17   Levert Feinstein, MD  polyethylene glycol (MIRALAX / Ethelene Hal) packet Take 17 g by mouth daily.    [provider]  rizatriptan (MAXALT-MLT) 10 MG disintegrating tablet DISSOLVE ONE TABLET BY MOUTH AS NEEDED. MAY REPEAT IN 2 HOURS IF NEEDED, maximum dose is 2 tabs every 24 hours 10/27/17   Levert Feinstein, MD  tiZANidine (ZANAFLEX) 4 MG tablet Take 1 tablet (4 mg total) by mouth every 6 (six) hours as needed. 10/27/17   Levert Feinstein, MD    Family History Family History  Problem Relation Age of Onset  . Asthma Mother        Posey Rea of father's histoy.    Social History Social History   Tobacco Use  . Smoking status: Never Smoker  . Smokeless tobacco: Never Used  Substance Use Topics  . Alcohol use: No  . Drug use: No     Allergies   Patient has no known allergies.   Review of  Systems Review of Systems  Constitutional: Negative for chills and fever.  HENT: Negative for congestion and rhinorrhea.   Eyes: Negative for redness and visual disturbance.  Respiratory: Negative for shortness of breath and wheezing.   Cardiovascular: Negative for chest pain and palpitations.  Gastrointestinal: Negative for nausea and vomiting.  Genitourinary: Negative for dysuria and urgency.  Musculoskeletal: Negative for arthralgias and myalgias.  Skin: Positive for itching and rash. Negative for pallor and wound.  Neurological: Negative for dizziness and headaches.     Physical Exam Updated Vital Signs BP 110/75 (BP Location: Right Arm)   Pulse 77   Temp 98.7 F (37.1 C) (Oral)   Resp 18   LMP 01/14/2018   SpO2 98%   Physical Exam  Constitutional: She is oriented to person, place, and time. She appears well-developed and well-nourished. No distress.  HENT:  Head: Normocephalic and atraumatic.  Eyes: Pupils are equal, round, and reactive to light. EOM are normal.  Neck: Normal range  of motion. Neck supple.  Cardiovascular: Normal rate and regular rhythm. Exam reveals no gallop and no friction rub.  No murmur heard. Pulmonary/Chest: Effort normal. She has no wheezes. She has no rales.  Abdominal: Soft. She exhibits no distension. There is no tenderness.  Musculoskeletal: She exhibits no edema or tenderness.  Neurological: She is alert and oriented to person, place, and time.  Skin: Skin is warm and dry. She is not diaphoretic.  Psychiatric: She has a normal mood and affect. Her behavior is normal.  Nursing note and vitals reviewed.    ED Treatments / Results  Labs (all labs ordered are listed, but only abnormal results are displayed) Labs Reviewed - No data to display  EKG None  Radiology No results found.  Procedures Procedures (including critical care time)  Medications Ordered in ED Medications  dexamethasone (DECADRON) tablet 10 mg (10 mg Oral Given 02/17/18 1621)     Initial Impression / Assessment and Plan / ED Course  I have reviewed the triage vital signs and the nursing notes.  Pertinent labs & imaging results that were available during my care of the patient were reviewed by me and considered in my medical decision making (see chart for details).     37 yo F with a chief complaint of a itchy rash to her right arm.  Appears to be poison ivy.  She is concerned about how itchy it is.  We will give her 1 dose of steroids.  Have her follow-up with her PCP.  4:22 PM:  I have discussed the diagnosis/risks/treatment options with the patient and believe the pt to be eligible for discharge home to follow-up with PCP. We also discussed returning to the ED immediately if new or worsening sx occur. We discussed the sx which are most concerning (e.g., sudden worsening pain, fever, inability to tolerate by mouth) that necessitate immediate return. Medications administered to the patient during their visit and any new prescriptions provided to the patient are  listed below.  Medications given during this visit Medications  dexamethasone (DECADRON) tablet 10 mg (10 mg Oral Given 02/17/18 1621)      The patient appears reasonably screen and/or stabilized for discharge and I doubt any other medical condition or other [facility] requiring further screening, evaluation, or treatment in the ED at this time prior to discharge.    Final Clinical Impressions(s) / ED Diagnoses   Final diagnoses:  Poison ivy dermatitis    ED Discharge Orders    None  Melene Plan, DO 02/17/18 1622
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
## 2018-04-29 ENCOUNTER — Ambulatory Visit: Payer: Self-pay | Admitting: Nurse Practitioner

## 2018-05-02 ENCOUNTER — Encounter: Payer: Self-pay | Admitting: Nurse Practitioner

## 2018-05-15 ENCOUNTER — Emergency Department (HOSPITAL_BASED_OUTPATIENT_CLINIC_OR_DEPARTMENT_OTHER): Payer: Medicaid Other

## 2018-05-15 ENCOUNTER — Other Ambulatory Visit: Payer: Self-pay

## 2018-05-15 ENCOUNTER — Encounter (HOSPITAL_BASED_OUTPATIENT_CLINIC_OR_DEPARTMENT_OTHER): Payer: Self-pay | Admitting: *Deleted

## 2018-05-15 ENCOUNTER — Emergency Department (HOSPITAL_BASED_OUTPATIENT_CLINIC_OR_DEPARTMENT_OTHER)
Admission: EM | Admit: 2018-05-15 | Discharge: 2018-05-15 | Disposition: A | Discharge status: Home / Self Care | Payer: Medicaid Other | Attending: Emergency Medicine | Admitting: Emergency Medicine

## 2018-05-15 DIAGNOSIS — Z79899 Other long term (current) drug therapy: Secondary | ICD-10-CM | POA: Diagnosis not present

## 2018-05-15 DIAGNOSIS — M79602 Pain in left arm: Secondary | ICD-10-CM | POA: Insufficient documentation

## 2018-05-15 NOTE — Discharge Instructions (Addendum)
Your xrays today were normal. Please continue to wear your velcro splint. You may apply ice or heat to the area.I have provide a referral to an orthopedist, please schedule an appointment as needed.
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
## 2018-05-15 NOTE — ED Triage Notes (Signed)
Pt states she injured her left arm on Wednesday, and has been seen by MD. She has on a velcro splint. States pain is worse. Denies new injury

## 2018-05-17 ENCOUNTER — Ambulatory Visit: Payer: Medicaid Other | Admitting: Family Medicine

## 2018-05-19 ENCOUNTER — Ambulatory Visit: Payer: Medicaid Other | Admitting: Podiatry

## 2018-05-19 ENCOUNTER — Other Ambulatory Visit: Payer: Self-pay | Admitting: Podiatry

## 2018-05-19 ENCOUNTER — Encounter: Payer: Self-pay | Admitting: Podiatry

## 2018-05-19 ENCOUNTER — Ambulatory Visit (INDEPENDENT_AMBULATORY_CARE_PROVIDER_SITE_OTHER): Payer: Medicaid Other

## 2018-05-19 DIAGNOSIS — M79671 Pain in right foot: Secondary | ICD-10-CM

## 2018-05-19 DIAGNOSIS — M722 Plantar fascial fibromatosis: Secondary | ICD-10-CM

## 2018-05-19 MED ORDER — TRIAMCINOLONE ACETONIDE 10 MG/ML IJ SUSP
10.0000 mg | Freq: Once | INTRAMUSCULAR | Status: AC
  Administered 2018-05-19: 10 mg

## 2018-05-22 NOTE — Progress Notes (Signed)
Subjective:   Patient ID: Gabriela Gonzalez, female   DOB: 37 y.o.   MRN: [id number]   HPI Patient presents stating she is just developed a lot of pain in the inside of her right ankle and she is concerned about what she may have done stating it is only been hurting for a few weeks   ROS      Objective:  Physical Exam  Neurovascular status intact with inflammation pain of the right mid ankle that sent tender when pressed     Assessment:  Acute fasciitis right mid arch     Plan:  H&P condition reviewed x-ray reviewed today I did a careful injection 3 mg Kenalog 5 Milgram Xylocaine and gave instructions physical therapy support shoes reappoint as indicated  X-rays were negative for signs of fracture or bone injury with mild depression of the arch
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
## 2018-08-15 ENCOUNTER — Encounter: Payer: Self-pay | Admitting: Nurse Practitioner

## 2018-08-15 ENCOUNTER — Ambulatory Visit: Payer: Medicaid Other | Admitting: Nurse Practitioner

## 2018-08-15 VITALS — BP 123/78 | HR 68 | Ht 59.0 in | Wt 174.4 lb

## 2018-08-15 DIAGNOSIS — M542 Cervicalgia: Secondary | ICD-10-CM

## 2018-08-15 DIAGNOSIS — G43009 Migraine without aura, not intractable, without status migrainosus: Secondary | ICD-10-CM

## 2018-08-15 MED ORDER — RIZATRIPTAN BENZOATE 10 MG PO TBDP: ORAL_TABLET | ORAL | 6 refills | Status: DC

## 2018-08-15 NOTE — Progress Notes (Signed)
I have reviewed and agreed above plan. 

## 2018-08-15 NOTE — Patient Instructions (Signed)
Continue  nortriptyline to 100 mg every night,    Cymbalta 60 mg every morning             Use Maxalt as needed, may combine it together with Zofran,Ibuprofen, but limit  the use to less than twice each week             Also suggested neck stretching exercise, hot compression  Continue to follow with hand specialist for left hand  F/U here 6 to 8 months

## 2019-02-15 ENCOUNTER — Encounter: Payer: Self-pay | Admitting: Neurology

## 2019-03-16 ENCOUNTER — Ambulatory Visit: Payer: Self-pay | Admitting: Neurology
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
## 2019-03-29 ENCOUNTER — Other Ambulatory Visit: Payer: Self-pay

## 2019-03-29 ENCOUNTER — Encounter: Payer: Self-pay | Admitting: Neurology

## 2019-03-29 ENCOUNTER — Ambulatory Visit: Payer: Medicaid Other | Admitting: Neurology

## 2019-03-29 VITALS — BP 116/80 | HR 76 | Temp 97.5°F | Ht 59.0 in | Wt 177.8 lb

## 2019-03-29 DIAGNOSIS — G43009 Migraine without aura, not intractable, without status migrainosus: Secondary | ICD-10-CM

## 2019-03-29 DIAGNOSIS — M542 Cervicalgia: Secondary | ICD-10-CM

## 2019-03-29 MED ORDER — TIZANIDINE HCL 4 MG PO TABS: 4.0000 mg | ORAL_TABLET | Freq: Four times a day (QID) | ORAL | 3 refills | Status: AC | PRN

## 2019-03-29 MED ORDER — RIZATRIPTAN BENZOATE 10 MG PO TBDP: ORAL_TABLET | ORAL | 6 refills | Status: AC

## 2019-03-29 NOTE — Patient Instructions (Addendum)
We will keep you off the Cymbalta and nortriptyline, if symptoms worsen let us know. You may continue with Maxalt and tizanidine as needed.

## 2019-03-29 NOTE — Progress Notes (Signed)
I have reviewed and agreed above plan. 

## 2019-09-26 ENCOUNTER — Other Ambulatory Visit: Payer: Self-pay

## 2019-09-26 ENCOUNTER — Ambulatory Visit (INDEPENDENT_AMBULATORY_CARE_PROVIDER_SITE_OTHER): Payer: Medicaid Other | Admitting: Neurology

## 2019-09-26 ENCOUNTER — Encounter: Payer: Self-pay | Admitting: Neurology

## 2019-09-26 VITALS — BP 115/67 | HR 69 | Temp 97.3°F | Ht 59.0 in | Wt 179.0 lb

## 2019-09-26 DIAGNOSIS — M542 Cervicalgia: Secondary | ICD-10-CM | POA: Diagnosis not present

## 2019-09-26 DIAGNOSIS — G43009 Migraine without aura, not intractable, without status migrainosus: Secondary | ICD-10-CM | POA: Diagnosis not present
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
## 2020-05-12 IMAGING — CR DG HAND COMPLETE 3+V*L*
3 series · 3 of 3 positions shown · non-contrast
Comparison: None.

CLINICAL DATA: Heavy object fell on left arm. Left shoulder and
hand pain.

EXAM:
LEFT HAND - COMPLETE 3+ VIEW

[x hand pa left]
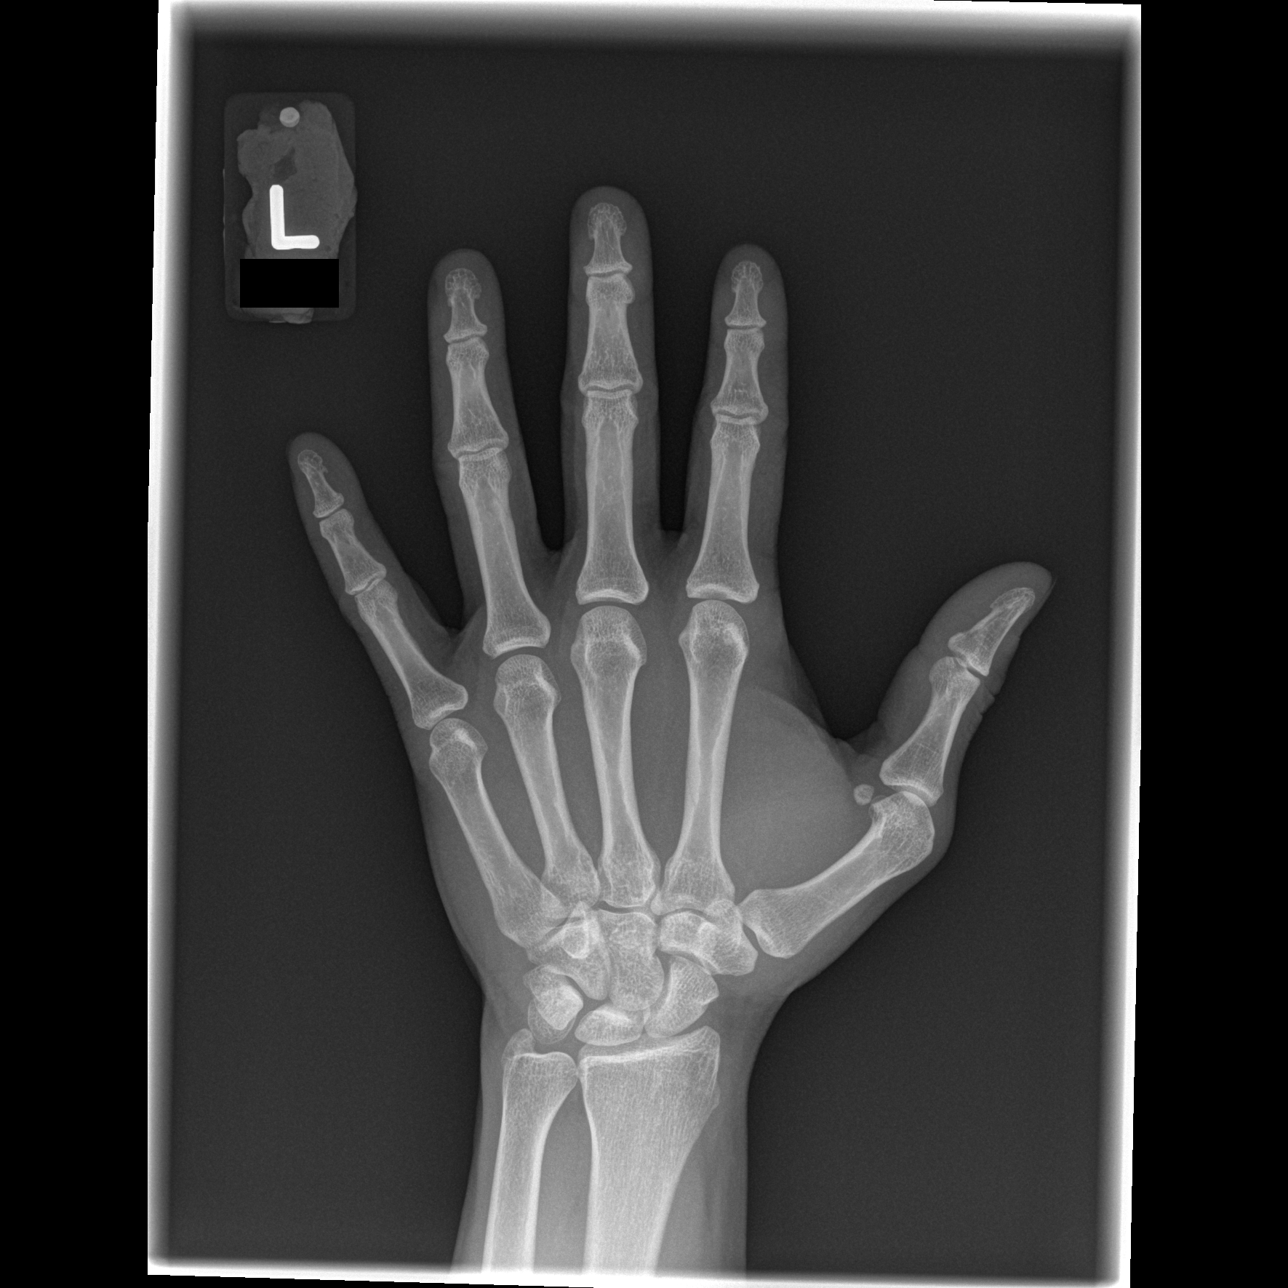

[x hand oblique left]
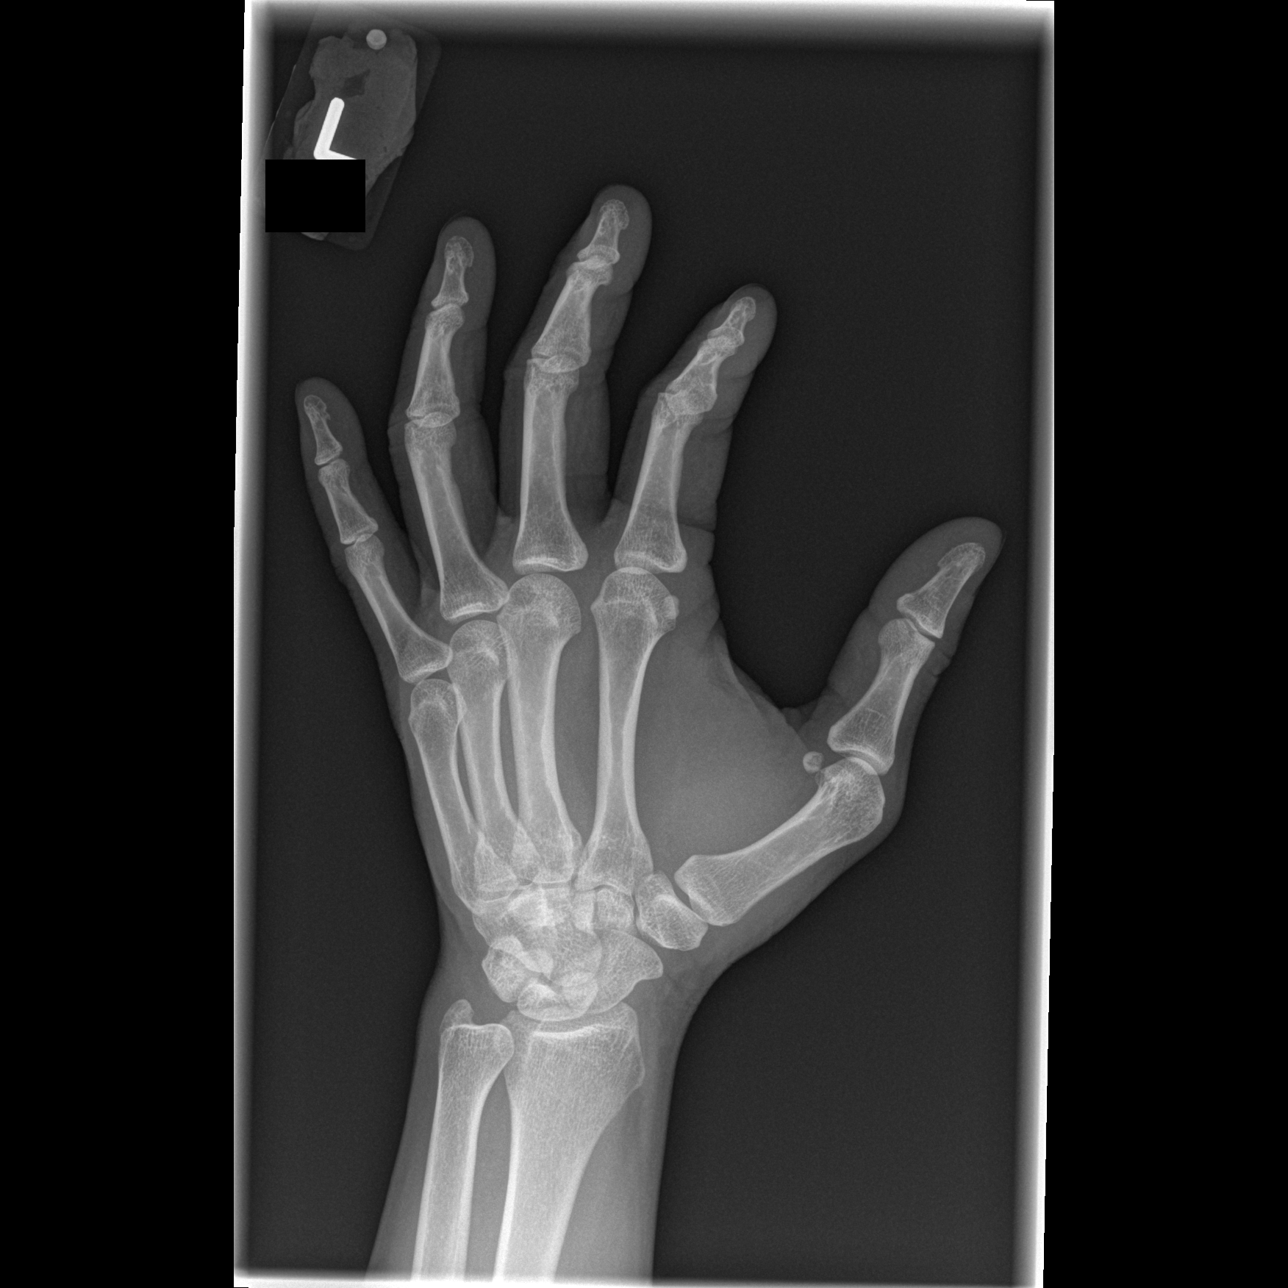

[x hand lat left]
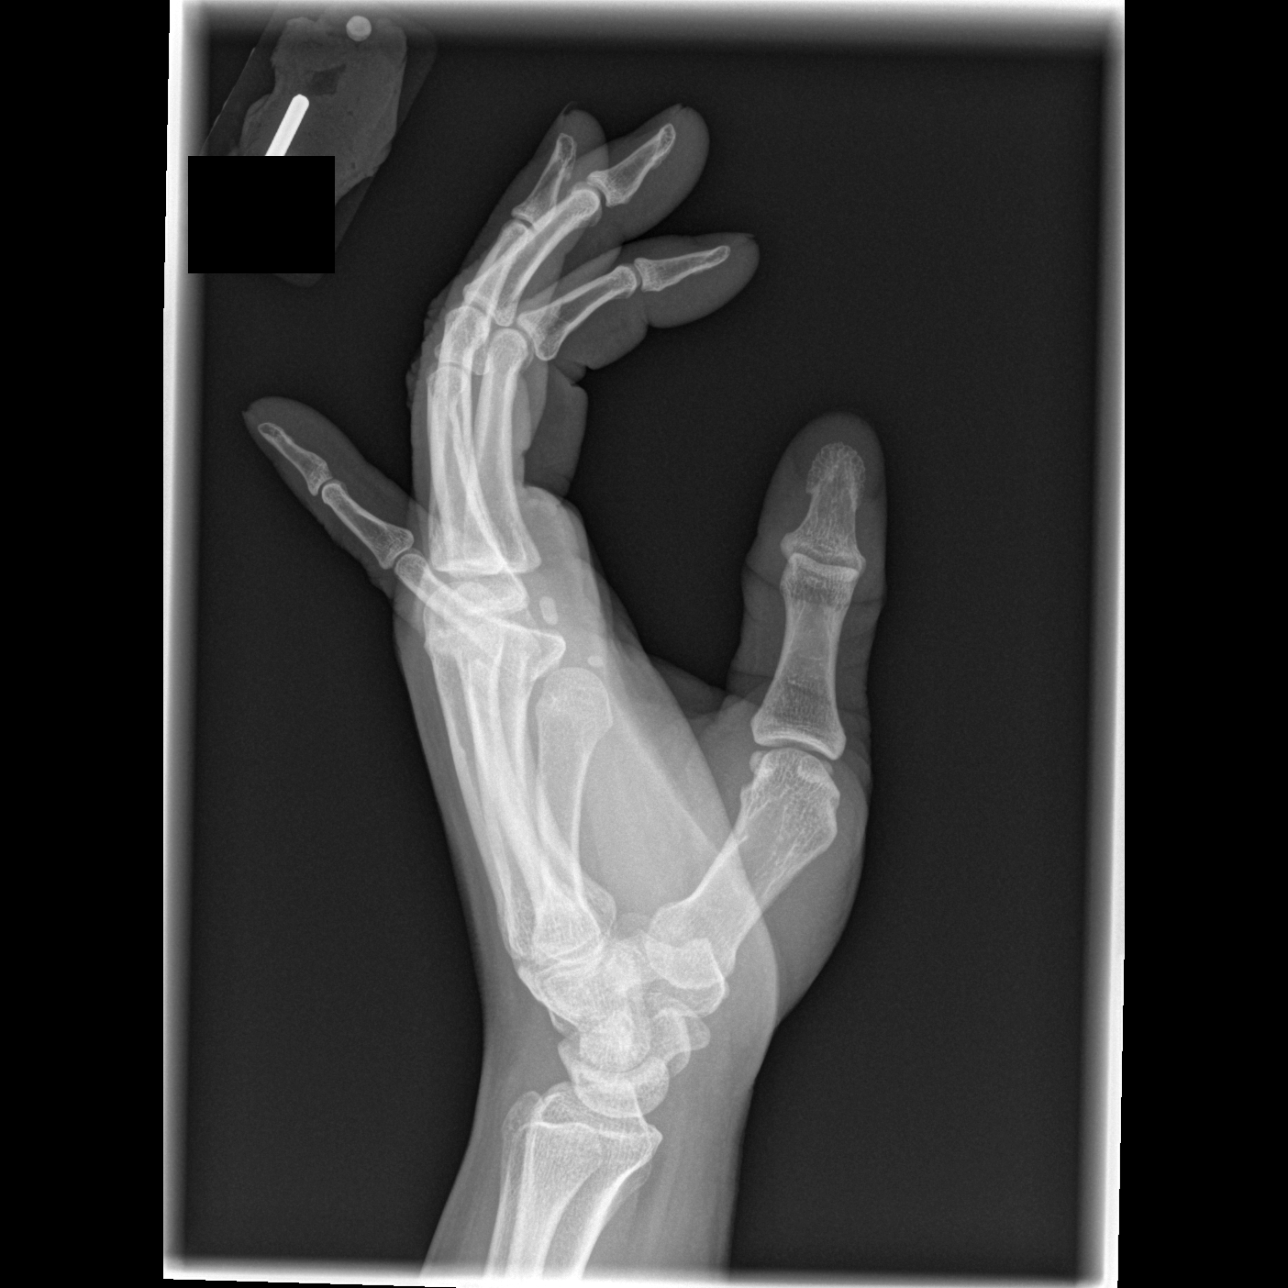

[3 of 3 positions shown; findings below may reference images not displayed]

FINDINGS: There is no evidence of fracture or dislocation. There is no
evidence of arthropathy or other focal bone abnormality. Soft
tissues are unremarkable.
IMPRESSION: Negative.
